# Patient Record
Sex: Female | Born: 1990 | Race: Black or African American | Hispanic: No | Marital: Single | Smoking: Never smoker
Health system: Southern US, Community
[De-identification: ages and names within clinical notes are randomized; demographics above are authoritative.]

## PROBLEM LIST (undated history)

## (undated) DIAGNOSIS — J45909 Unspecified asthma, uncomplicated: Secondary | ICD-10-CM

## (undated) HISTORY — PX: NO PAST SURGERIES: SHX2092

---
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
## 2011-06-26 ENCOUNTER — Emergency Department (HOSPITAL_COMMUNITY)
Admission: EM | Admit: 2011-06-26 | Discharge: 2011-06-26 | Disposition: A | Discharge status: Home / Self Care | Payer: Self-pay | Attending: Emergency Medicine | Admitting: Emergency Medicine

## 2011-06-26 DIAGNOSIS — L500 Allergic urticaria: Secondary | ICD-10-CM | POA: Insufficient documentation

## 2011-10-04 ENCOUNTER — Encounter (HOSPITAL_COMMUNITY): Payer: Self-pay | Admitting: *Deleted

## 2011-10-04 ENCOUNTER — Emergency Department (HOSPITAL_COMMUNITY)
Admission: EM | Admit: 2011-10-04 | Discharge: 2011-10-04 | Disposition: A | Discharge status: Home / Self Care | Payer: Medicaid Other | Attending: Emergency Medicine | Admitting: Emergency Medicine

## 2011-10-04 DIAGNOSIS — J029 Acute pharyngitis, unspecified: Secondary | ICD-10-CM | POA: Insufficient documentation

## 2011-10-04 DIAGNOSIS — R509 Fever, unspecified: Secondary | ICD-10-CM | POA: Insufficient documentation

## 2011-10-04 DIAGNOSIS — R059 Cough, unspecified: Secondary | ICD-10-CM | POA: Insufficient documentation

## 2011-10-04 DIAGNOSIS — R5381 Other malaise: Secondary | ICD-10-CM | POA: Insufficient documentation

## 2011-10-04 DIAGNOSIS — R05 Cough: Secondary | ICD-10-CM | POA: Insufficient documentation

## 2011-10-04 MED ORDER — SODIUM CHLORIDE 0.9 % IV BOLUS (SEPSIS)
1000.0000 mL | Freq: Once | INTRAVENOUS | Status: AC
  Administered 2011-10-04: 1000 mL via INTRAVENOUS

## 2011-10-04 MED ORDER — DEXAMETHASONE SODIUM PHOSPHATE 10 MG/ML IJ SOLN
10.0000 mg | Freq: Once | INTRAMUSCULAR | Status: AC
  Administered 2011-10-04: 10 mg via INTRAVENOUS
  Filled 2011-10-04: qty 1

## 2011-10-04 MED ORDER — GI COCKTAIL ~~LOC~~
30.0000 mL | Freq: Once | ORAL | Status: AC
  Administered 2011-10-04: 30 mL via ORAL
  Filled 2011-10-04: qty 30

## 2011-10-04 MED ORDER — CLINDAMYCIN PHOSPHATE 600 MG/50ML IV SOLN
600.0000 mg | Freq: Once | INTRAVENOUS | Status: AC
  Administered 2011-10-04: 600 mg via INTRAVENOUS
  Filled 2011-10-04: qty 50

## 2011-10-04 MED ORDER — CLINDAMYCIN HCL 150 MG PO CAPS: 150.0000 mg | ORAL_CAPSULE | Freq: Four times a day (QID) | ORAL | Status: AC

## 2011-10-04 MED ORDER — ONDANSETRON HCL 4 MG/2ML IJ SOLN
4.0000 mg | Freq: Once | INTRAMUSCULAR | Status: AC
  Administered 2011-10-04: 4 mg via INTRAVENOUS
  Filled 2011-10-04: qty 2

## 2011-10-04 MED ORDER — MORPHINE SULFATE 4 MG/ML IJ SOLN
4.0000 mg | Freq: Once | INTRAMUSCULAR | Status: AC
  Administered 2011-10-04: 4 mg via INTRAVENOUS
  Filled 2011-10-04: qty 1

## 2011-10-04 MED ORDER — MAGIC MOUTHWASH W/LIDOCAINE: 15.0000 mL | Freq: Four times a day (QID) | ORAL | Status: DC | PRN

## 2011-10-04 NOTE — Discharge Instructions (Signed)
Sore Throat Sore throats may be caused by bacteria and viruses. They may also be caused by:  Smoking.   Pollution.   Allergies.  If a sore throat is due to strep infection (a bacterial infection), you may need:  A throat swab.   A culture test to verify the strep infection.  You will need one of these:  An antibiotic shot.   Oral medicine for a full 10 days.  Strep infection is very contagious. A doctor should check any close contacts who have a sore throat or fever. A sore throat caused by a virus infection will usually last only 3-4 days. Antibiotics will not treat a viral sore throat.  Infectious mononucleosis (a viral disease), however, can cause a sore throat that lasts for up to 3 weeks. Mononucleosis can be diagnosed with blood tests. You must have been sick for at least 1 week in order for the test to give accurate results. HOME CARE INSTRUCTIONS   To treat a sore throat, take mild pain medicine.   Increase your fluids.   Eat a soft diet.   Do not smoke.   Gargling with warm water or salt water (1 tsp. salt in 8 oz. water) can be helpful.   Try throat sprays or lozenges or sucking on hard candy to ease the symptoms.  Call your doctor if your sore throat lasts longer than 1 week.  SEEK IMMEDIATE MEDICAL CARE IF:  You have difficulty breathing.   You have increased swelling in the throat.   You have pain so severe that you are unable to swallow fluids or your saliva.   You have a severe headache, a high fever, vomiting, or a red rash.  Document Released: 09/23/2004 Document Revised: 04/28/2011 Document Reviewed: 08/03/2007 [employer] Patient Information 2012 [employer]. 

## 2011-10-04 NOTE — ED Notes (Signed)
Pt reports sore throat and vomiting since yesterday. Fever yesterday. Reports congestion, chills.

## 2011-10-04 NOTE — ED Provider Notes (Signed)
History     CSN: [id number]  Arrival date & time 10/04/11  1230   First MD Initiated Contact with Patient 10/04/11 1452      Chief Complaint  Patient presents with  . Emesis  . Sore Throat    (Consider location/radiation/quality/duration/timing/severity/associated sxs/prior treatment) HPI Comments: Has associated URI symptoms.  No odynophagia or dysphagia  Patient is a 21 y.o. female presenting with pharyngitis. The history is provided by the patient and a parent. No language interpreter was used.  Sore Throat This is a new problem. The current episode started yesterday. The problem occurs constantly. The problem has been gradually worsening. Pertinent negatives include no chest pain, no abdominal pain, no headaches and no shortness of breath. The symptoms are aggravated by swallowing and eating. The symptoms are relieved by nothing. She has tried nothing for the symptoms. The treatment provided no relief.    Past Medical History  Diagnosis Date  . Arthritis     History reviewed. No pertinent past surgical history.  No family history on file.  History  Substance Use Topics  . Smoking status: Never Smoker   . Smokeless tobacco: Not on file  . Alcohol Use: No    OB History    Grav Para Term Preterm Abortions TAB SAB Ect Mult Living                  Review of Systems  Constitutional: Positive for fever and fatigue. Negative for activity change and appetite change.  HENT: Positive for congestion, sore throat and rhinorrhea. Negative for drooling, trouble swallowing, neck pain, neck stiffness and voice change.   Respiratory: Positive for cough. Negative for shortness of breath.   Cardiovascular: Negative for chest pain and palpitations.  Gastrointestinal: Negative for nausea, vomiting and abdominal pain.  Genitourinary: Negative for dysuria, urgency, frequency and flank pain.  Musculoskeletal: Negative for myalgias, back pain and arthralgias.  Neurological: Negative for  dizziness, weakness, light-headedness, numbness and headaches.  All other systems reviewed and are negative.    Allergies  Review of patient's allergies indicates no known allergies.  Home Medications   Current Outpatient Rx  Name Route Sig Dispense Refill  . ALBUTEROL SULFATE HFA 108 (90 BASE) MCG/ACT IN AERS Inhalation Inhale 2 puffs into the lungs every 6 (six) hours as needed. wheezing    . ASPIRIN EFFERVESCENT 325 MG PO TBEF Oral Take 325 mg by mouth every 6 (six) hours as needed. Cold /flu    . IBUPROFEN 200 MG PO TABS Oral Take 200 mg by mouth every 6 (six) hours as needed. pain    . MAGIC MOUTHWASH W/LIDOCAINE Oral Take 15 mLs by mouth 4 (four) times daily as needed. 150 mL 0  . CLINDAMYCIN HCL 150 MG PO CAPS Oral Take 1 capsule (150 mg total) by mouth every 6 (six) hours. 28 capsule 0    BP 145/80  Pulse 88  Temp(Src) 98.4 F (36.9 C) (Oral)  Resp 16  Ht 5\' 10"  (1.778 m)  SpO2 98%  LMP 09/03/2011  Physical Exam  Nursing note and vitals reviewed. Constitutional: She is oriented to person, place, and time. She appears well-developed and well-nourished. No distress.  HENT:  Head: Normocephalic and atraumatic.  Mouth/Throat: Oropharyngeal exudate present.       Oropharyngeal erythema and swelling - no evidence of PTA  Eyes: Conjunctivae and EOM are normal. Pupils are equal, round, and reactive to light.  Neck: Normal range of motion. Neck supple.  Cardiovascular: Normal rate, normal heart  sounds and intact distal pulses.  Exam reveals no gallop and no friction rub.   No murmur heard. Pulmonary/Chest: Effort normal and breath sounds normal. No respiratory distress.  Abdominal: Soft. Bowel sounds are normal. There is no tenderness.  Musculoskeletal: Normal range of motion. She exhibits no tenderness.  Lymphadenopathy:    She has no cervical adenopathy.  Neurological: She is alert and oriented to person, place, and time. No cranial nerve deficit.  Skin: Skin is warm and  dry. No rash noted.    ED Course  Procedures (including critical care time)   Labs Reviewed  RAPID STREP SCREEN   No results found.   1. Pharyngitis       MDM  Pharyngitis. Strep was negative. She'll be placed on clindamycin to cover for early tonsillitis. She received a dose of Decadron emergency department. I also administered a GI cocktail for comfort. She'll be discharged home with instructions to take the medication as directed and to continue aggressive oral hydration at home.        Dayton Bailiff, MD 10/04/11 [id number]

## 2013-10-10 ENCOUNTER — Emergency Department (HOSPITAL_COMMUNITY): Payer: Self-pay

## 2013-10-10 ENCOUNTER — Emergency Department (HOSPITAL_COMMUNITY)
Admission: EM | Admit: 2013-10-10 | Discharge: 2013-10-10 | Disposition: A | Discharge status: Home / Self Care | Payer: Self-pay | Attending: Emergency Medicine | Admitting: Emergency Medicine

## 2013-10-10 ENCOUNTER — Encounter (HOSPITAL_COMMUNITY): Payer: Self-pay | Admitting: Emergency Medicine

## 2013-10-10 DIAGNOSIS — Z79899 Other long term (current) drug therapy: Secondary | ICD-10-CM | POA: Insufficient documentation

## 2013-10-10 DIAGNOSIS — R000 Tachycardia, unspecified: Secondary | ICD-10-CM | POA: Insufficient documentation

## 2013-10-10 DIAGNOSIS — J069 Acute upper respiratory infection, unspecified: Secondary | ICD-10-CM | POA: Insufficient documentation

## 2013-10-10 DIAGNOSIS — J111 Influenza due to unidentified influenza virus with other respiratory manifestations: Secondary | ICD-10-CM | POA: Insufficient documentation

## 2013-10-10 DIAGNOSIS — R6889 Other general symptoms and signs: Secondary | ICD-10-CM

## 2013-10-10 DIAGNOSIS — M129 Arthropathy, unspecified: Secondary | ICD-10-CM | POA: Insufficient documentation

## 2013-10-10 IMAGING — CR DG CHEST 2V
2 series · 2 of 2 positions shown · non-contrast
Comparison: June 13, 2008

CLINICAL DATA: Cough, chills

EXAM:
CHEST  2 VIEW

[w chest pa]
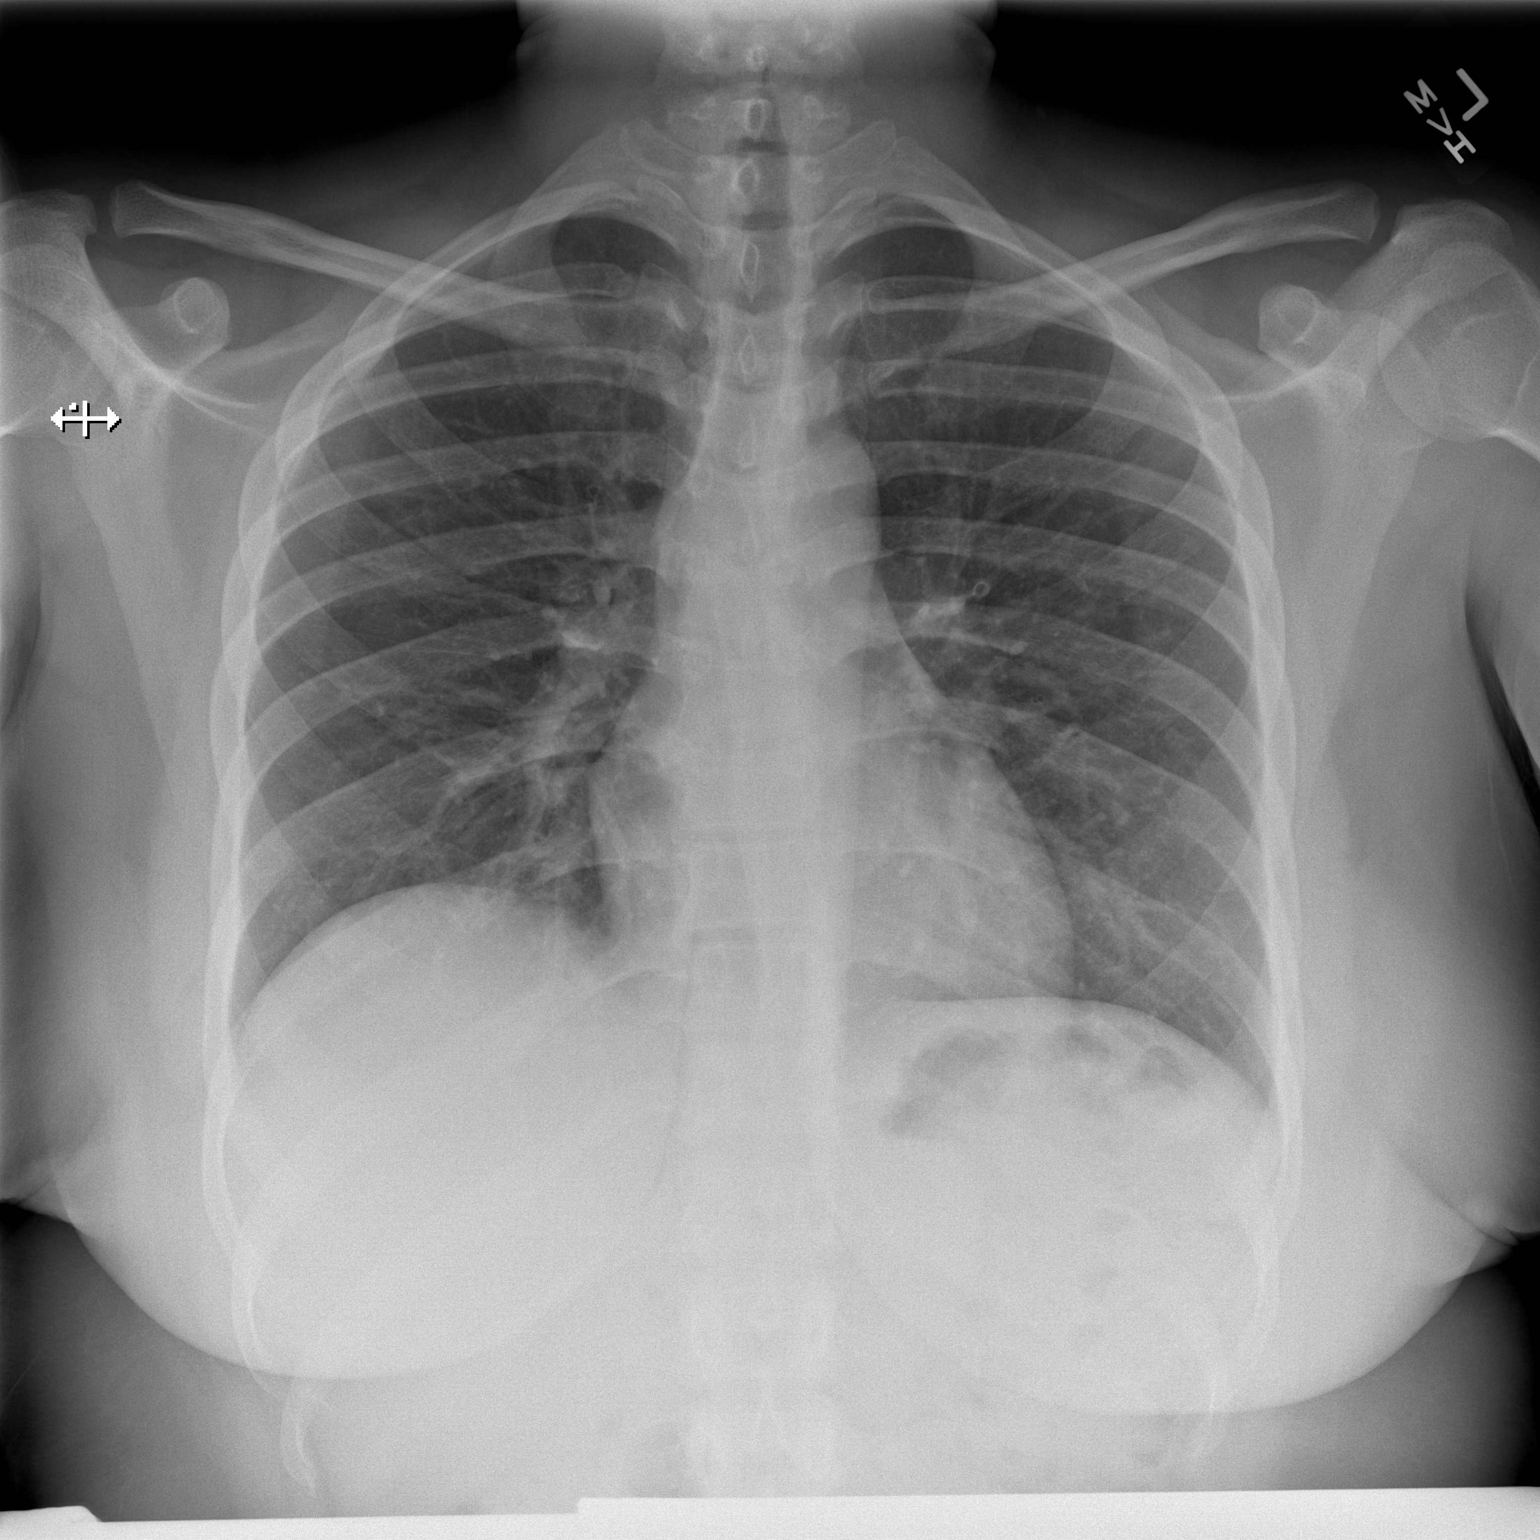

[w chest lat]
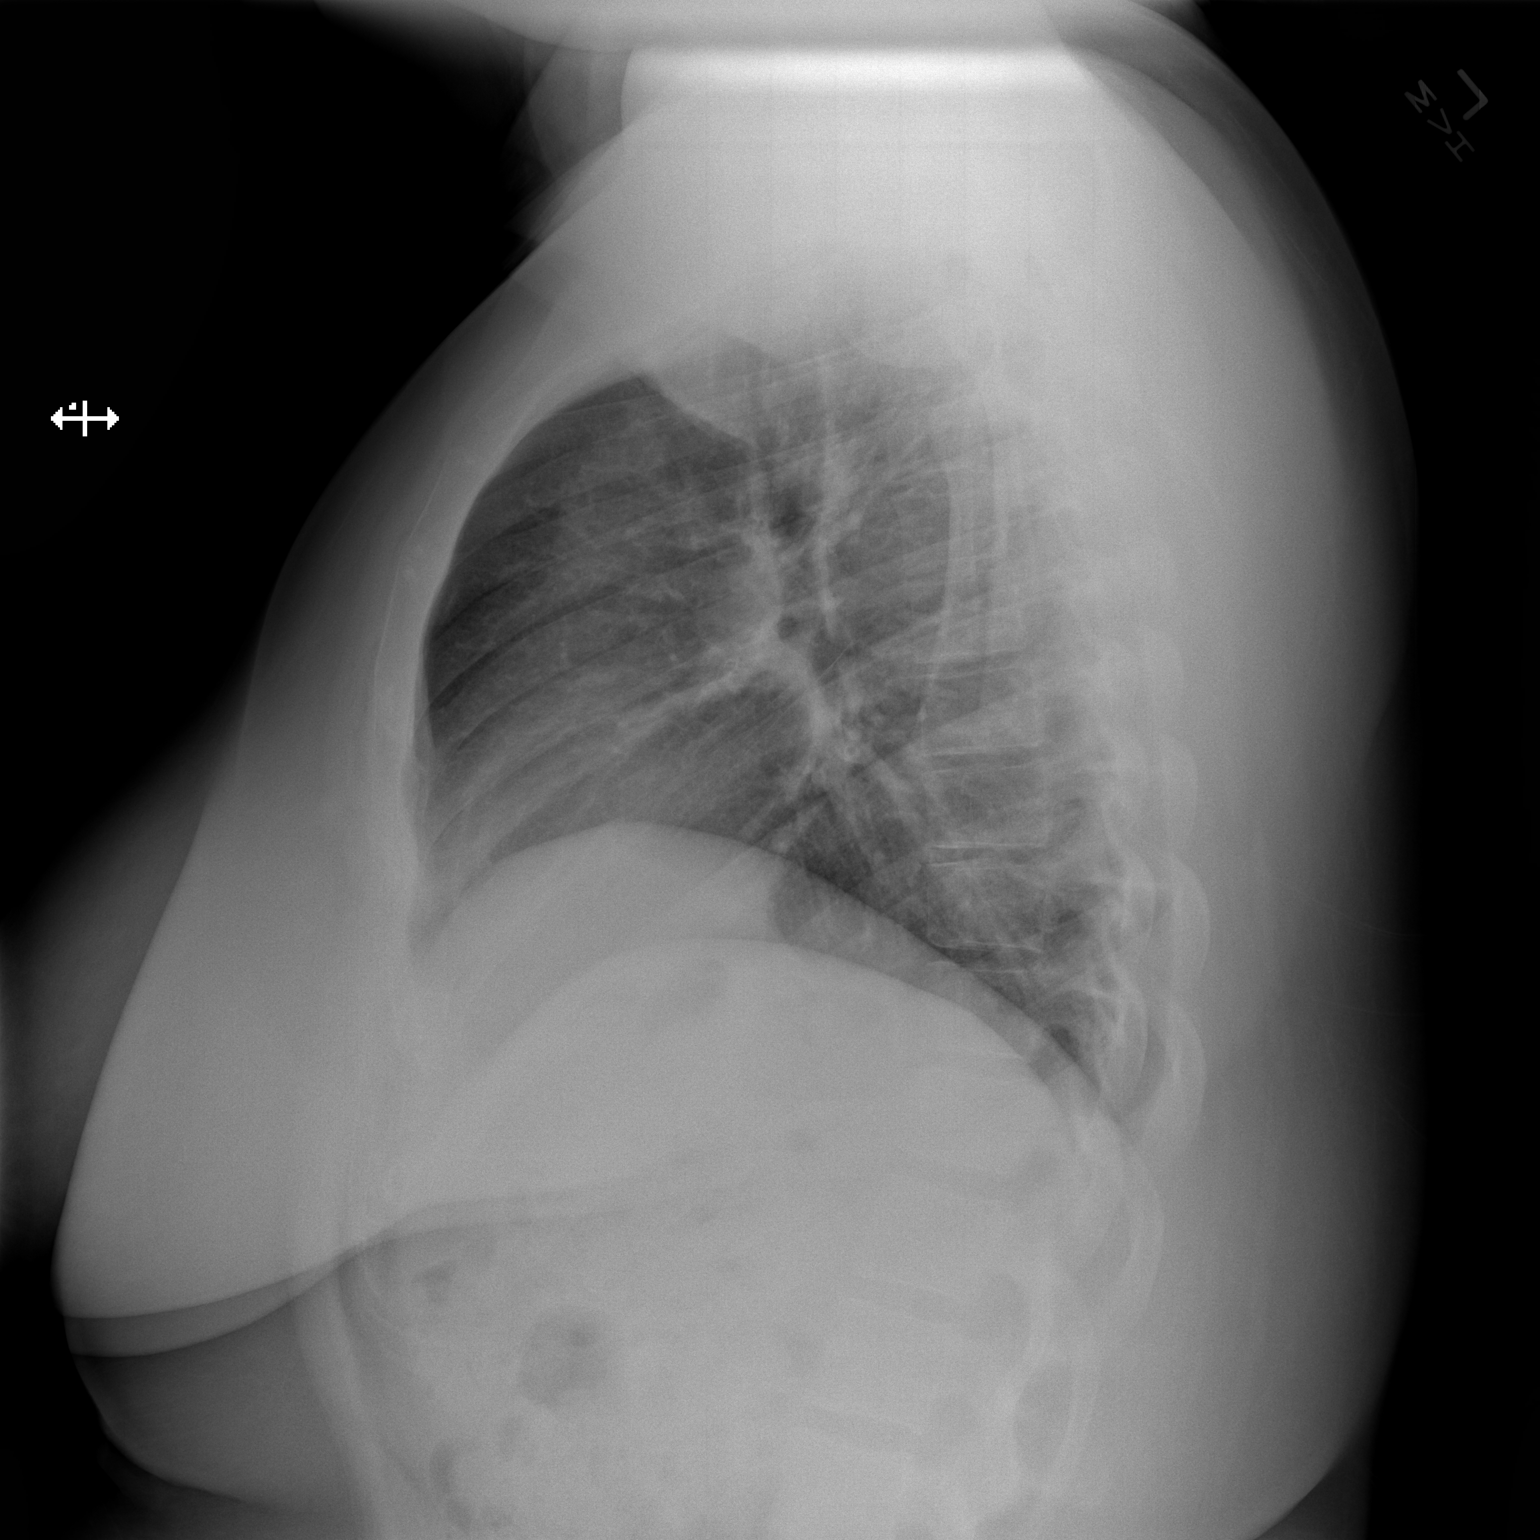

[2 of 2 positions shown; findings below may reference images not displayed]

FINDINGS: The heart size and mediastinal contours are within normal limits.
There is no focal infiltrate, pulmonary edema, or pleural effusion.
The visualized skeletal structures are stable.
IMPRESSION: No active cardiopulmonary disease.

## 2013-10-10 MED ORDER — ACETAMINOPHEN 325 MG PO TABS
650.0000 mg | ORAL_TABLET | Freq: Once | ORAL | Status: AC
  Administered 2013-10-10: 650 mg via ORAL
  Filled 2013-10-10: qty 2

## 2013-10-10 MED ORDER — ALBUTEROL SULFATE HFA 108 (90 BASE) MCG/ACT IN AERS
2.0000 | INHALATION_SPRAY | Freq: Once | RESPIRATORY_TRACT | Status: AC
  Administered 2013-10-10: 2 via RESPIRATORY_TRACT
  Filled 2013-10-10: qty 6.7

## 2013-10-10 MED ORDER — ALBUTEROL SULFATE (2.5 MG/3ML) 0.083% IN NEBU
5.0000 mg | INHALATION_SOLUTION | Freq: Once | RESPIRATORY_TRACT | Status: AC
  Administered 2013-10-10: 5 mg via RESPIRATORY_TRACT
  Filled 2013-10-10: qty 6

## 2013-10-10 NOTE — Discharge Instructions (Signed)
Use inhaler as directed. Rest, stay well-hydrated. Continue taking over-the-counter medications as needed.  Upper Respiratory Infection, Adult An upper respiratory infection (URI) is also sometimes known as the common cold. The upper respiratory tract includes the nose, sinuses, throat, trachea, and bronchi. Bronchi are the airways leading to the lungs. Most people improve within 1 week, but symptoms can last up to 2 weeks. A residual cough may last even longer.  CAUSES Many different viruses can infect the tissues lining the upper respiratory tract. The tissues become irritated and inflamed and often become very moist. Mucus production is also common. A cold is contagious. You can easily spread the virus to others by oral contact. This includes kissing, sharing a glass, coughing, or sneezing. Touching your mouth or nose and then touching a surface, which is then touched by another person, can also spread the virus. SYMPTOMS  Symptoms typically develop 1 to 3 days after you come in contact with a cold virus. Symptoms vary from person to person. They may include:  Runny nose.  Sneezing.  Nasal congestion.  Sinus irritation.  Sore throat.  Loss of voice (laryngitis).  Cough.  Fatigue.  Muscle aches.  Loss of appetite.  Headache.  Low-grade fever. DIAGNOSIS  You might diagnose your own cold based on familiar symptoms, since most people get a cold 2 to 3 times a year. Your caregiver can confirm this based on your exam. Most importantly, your caregiver can check that your symptoms are not due to another disease such as strep throat, sinusitis, pneumonia, asthma, or epiglottitis. Blood tests, throat tests, and X-rays are not necessary to diagnose a common cold, but they may sometimes be helpful in excluding other more serious diseases. Your caregiver will decide if any further tests are required. RISKS AND COMPLICATIONS  You may be at risk for a more severe case of the common cold if you  smoke cigarettes, have chronic heart disease (such as heart failure) or lung disease (such as asthma), or if you have a weakened immune system. The very young and very old are also at risk for more serious infections. Bacterial sinusitis, middle ear infections, and bacterial pneumonia can complicate the common cold. The common cold can worsen asthma and chronic obstructive pulmonary disease (COPD). Sometimes, these complications can require emergency medical care and may be life-threatening. PREVENTION  The best way to protect against getting a cold is to practice good hygiene. Avoid oral or hand contact with people with cold symptoms. Wash your hands often if contact occurs. There is no clear evidence that vitamin C, vitamin E, echinacea, or exercise reduces the chance of developing a cold. However, it is always recommended to get plenty of rest and practice good nutrition. TREATMENT  Treatment is directed at relieving symptoms. There is no cure. Antibiotics are not effective, because the infection is caused by a virus, not by bacteria. Treatment may include:  Increased fluid intake. Sports drinks offer valuable electrolytes, sugars, and fluids.  Breathing heated mist or steam (vaporizer or shower).  Eating chicken soup or other clear broths, and maintaining good nutrition.  Getting plenty of rest.  Using gargles or lozenges for comfort.  Controlling fevers with ibuprofen or acetaminophen as directed by your caregiver.  Increasing usage of your inhaler if you have asthma. Zinc gel and zinc lozenges, taken in the first 24 hours of the common cold, can shorten the duration and lessen the severity of symptoms. Pain medicines may help with fever, muscle aches, and throat pain. A  variety of non-prescription medicines are available to treat congestion and runny nose. Your caregiver can make recommendations and may suggest nasal or lung inhalers for other symptoms.  HOME CARE INSTRUCTIONS   Only take  over-the-counter or prescription medicines for pain, discomfort, or fever as directed by your caregiver.  Use a warm mist humidifier or inhale steam from a shower to increase air moisture. This may keep secretions moist and make it easier to breathe.  Drink enough water and fluids to keep your urine clear or pale yellow.  Rest as needed.  Return to work when your temperature has returned to normal or as your caregiver advises. You may need to stay home longer to avoid infecting others. You can also use a face mask and careful hand washing to prevent spread of the virus. SEEK MEDICAL CARE IF:   After the first few days, you feel you are getting worse rather than better.  You need your caregiver's advice about medicines to control symptoms.  You develop chills, worsening shortness of breath, or brown or red sputum. These may be signs of pneumonia.  You develop yellow or brown nasal discharge or pain in the face, especially when you bend forward. These may be signs of sinusitis.  You develop a fever, swollen neck glands, pain with swallowing, or white areas in the back of your throat. These may be signs of strep throat. SEEK IMMEDIATE MEDICAL CARE IF:   You have a fever.  You develop severe or persistent headache, ear pain, sinus pain, or chest pain.  You develop wheezing, a prolonged cough, cough up blood, or have a change in your usual mucus (if you have chronic lung disease).  You develop sore muscles or a stiff neck. Document Released: 02/09/2001 Document Revised: 11/08/2011 Document Reviewed: 12/18/2010 [facility] Patient Information 2014 [place], [place].  Viral Infections A viral infection can be caused by different types of viruses.Most viral infections are not serious and resolve on their own. However, some infections may cause severe symptoms and may lead to further complications. SYMPTOMS Viruses can frequently cause:  Minor sore throat.  Aches and  pains.  Headaches.  Runny nose.  Different types of rashes.  Watery eyes.  Tiredness.  Cough.  Loss of appetite.  Gastrointestinal infections, resulting in nausea, vomiting, and diarrhea. These symptoms do not respond to antibiotics because the infection is not caused by bacteria. However, you might catch a bacterial infection following the viral infection. This is sometimes called a "superinfection." Symptoms of such a bacterial infection may include:  Worsening sore throat with pus and difficulty swallowing.  Swollen neck glands.  Chills and a high or persistent fever.  Severe headache.  Tenderness over the sinuses.  Persistent overall ill feeling (malaise), muscle aches, and tiredness (fatigue).  Persistent cough.  Yellow, green, or brown mucus production with coughing. HOME CARE INSTRUCTIONS   Only take over-the-counter or prescription medicines for pain, discomfort, diarrhea, or fever as directed by your caregiver.  Drink enough water and fluids to keep your urine clear or pale yellow. Sports drinks can provide valuable electrolytes, sugars, and hydration.  Get plenty of rest and maintain proper nutrition. Soups and broths with crackers or rice are fine. SEEK IMMEDIATE MEDICAL CARE IF:   You have severe headaches, shortness of breath, chest pain, neck pain, or an unusual rash.  You have uncontrolled vomiting, diarrhea, or you are unable to keep down fluids.  You or your child has an oral temperature above 102 F (38.9 C), not  controlled by medicine.  Your baby is older than 3 months with a rectal temperature of 102 F (38.9 C) or higher.  Your baby is 26 months old or younger with a rectal temperature of 100.4 F (38 C) or higher. MAKE SURE YOU:   Understand these instructions.  Will watch your condition.  Will get help right away if you are not doing well or get worse. Document Released: 05/26/2005 Document Revised: 11/08/2011 Document Reviewed:  12/21/2010 [facility] Patient Information 2014 [place], [place].  Influenza, Adult Influenza ("the flu") is a viral infection of the respiratory tract. It occurs more often in winter months because people spend more time in close contact with one another. Influenza can make you feel very sick. Influenza easily spreads from person to person (contagious). CAUSES  Influenza is caused by a virus that infects the respiratory tract. You can catch the virus by breathing in droplets from an infected person's cough or sneeze. You can also catch the virus by touching something that was recently contaminated with the virus and then touching your mouth, nose, or eyes. SYMPTOMS  Symptoms typically last 4 to 10 days and may include:  Fever.  Chills.  Headache, body aches, and muscle aches.  Sore throat.  Chest discomfort and cough.  Poor appetite.  Weakness or feeling tired.  Dizziness.  Nausea or vomiting. DIAGNOSIS  Diagnosis of influenza is often made based on your history and a physical exam. A nose or throat swab test can be done to confirm the diagnosis. RISKS AND COMPLICATIONS You may be at risk for a more severe case of influenza if you smoke cigarettes, have diabetes, have chronic heart disease (such as heart failure) or lung disease (such as asthma), or if you have a weakened immune system. Elderly people and pregnant women are also at risk for more serious infections. The most common complication of influenza is a lung infection (pneumonia). Sometimes, this complication can require emergency medical care and may be life-threatening. PREVENTION  An annual influenza vaccination (flu shot) is the best way to avoid getting influenza. An annual flu shot is now routinely recommended for all adults in the [place] TREATMENT  In mild cases, influenza goes away on its own. Treatment is directed at relieving symptoms. For more severe cases, your caregiver may prescribe antiviral medicines to shorten  the sickness. Antibiotic medicines are not effective, because the infection is caused by a virus, not by bacteria. HOME CARE INSTRUCTIONS  Only take over-the-counter or prescription medicines for pain, discomfort, or fever as directed by your caregiver.  Use a cool mist humidifier to make breathing easier.  Get plenty of rest until your temperature returns to normal. This usually takes 3 to 4 days.  Drink enough fluids to keep your urine clear or pale yellow.  Cover your mouth and nose when coughing or sneezing, and wash your hands well to avoid spreading the virus.  Stay home from work or school until your fever has been gone for at least 1 full day. SEEK MEDICAL CARE IF:   You have chest pain or a deep cough that worsens or produces more mucus.  You have nausea, vomiting, or diarrhea. SEEK IMMEDIATE MEDICAL CARE IF:   You have difficulty breathing, shortness of breath, or your skin or nails turn bluish.  You have severe neck pain or stiffness.  You have a severe headache, facial pain, or earache.  You have a worsening or recurring fever.  You have nausea or vomiting that cannot be controlled.  MAKE SURE YOU:  Understand these instructions.  Will watch your condition.  Will get help right away if you are not doing well or get worse. Document Released: 08/13/2000 Document Revised: 02/15/2012 Document Reviewed: 11/15/2011 [facility] Patient Information 2014 [place], [place].

## 2013-10-10 NOTE — ED Notes (Signed)
Pt c/o cough, chills, body aches x 2 days. Denies n/v/d.

## 2013-10-10 NOTE — ED Provider Notes (Signed)
CSN: [id number]     Arrival date & time 10/10/13  2025 History  This chart was scribed for non-physician practitioner, Nada Boozerobyn M. Mathis FareAlbert, PA-C working with Rolland PorterMark James, MD by Luisa DagoPriscilla Tutu, ED scribe. This patient was seen in room WTR9/WTR9 and the patient's care was started at 8:52 PM.    Chief Complaint  Patient presents with  . Generalized Body Aches  . Cough  . Chills    The history is provided by the patient. No language interpreter was used.   Marland Kitchen.HPI Comments: Erin Allison is a 23 y.o. female who presents to the Emergency Department complaining of generalized myalgias that started 2 days ago. Pt is also complaining of a dry cough and chills. She reports taking alka-seltzer with little to no relief. Pt states that she couldn't do much today due to her symptoms, so she slept all day. Pt denies any sore throat. She denies any sick contacts or recent travels.  Past Medical History  Diagnosis Date  . Arthritis    History reviewed. No pertinent past surgical history. No family history on file. History  Substance Use Topics  . Smoking status: Never Smoker   . Smokeless tobacco: Not on file  . Alcohol Use: Yes     Comment: occasional   OB History   Grav Para Term Preterm Abortions TAB SAB Ect Mult Living                 Review of Systems A complete 10 system review of systems was obtained and all systems are negative except as noted in the HPI and PMH.     Allergies  Review of patient's allergies indicates no known allergies.  Home Medications   Current Outpatient Rx  Name  Route  Sig  Dispense  Refill  . albuterol (PROVENTIL HFA;VENTOLIN HFA) 108 (90 BASE) MCG/ACT inhaler   Inhalation   Inhale 2 puffs into the lungs every 6 (six) hours as needed for wheezing. wheezing         . aspirin-sod bicarb-citric acid (ALKA-SELTZER) 325 MG TBEF   Oral   Take 325 mg by mouth every 6 (six) hours as needed. Cold /flu         . ibuprofen (ADVIL,MOTRIN) 200 MG tablet  Oral   Take 200 mg by mouth every 6 (six) hours as needed. pain          Triage vitals: BP 136/83  Pulse 108  Temp(Src) 101.3 F (38.5 C) (Oral)  Resp 22  Ht 5\' 9"  (1.753 m)  Wt 260 lb (117.935 kg)  BMI 38.38 kg/m2  SpO2 99%  Physical Exam  Nursing note and vitals reviewed. Constitutional: She is oriented to person, place, and time. She appears well-developed and well-nourished. No distress.  HENT:  Head: Normocephalic and atraumatic.  Right Ear: Tympanic membrane normal.  Left Ear: Tympanic membrane normal.  Mouth/Throat: Oropharynx is clear and moist.  Nasal turbinate is inflamed bilaterally. Post nasal drip.   Eyes: Conjunctivae and EOM are normal.  Neck: Normal range of motion. Neck supple.  Cardiovascular: Regular rhythm and normal heart sounds.  Tachycardia present.   Pulmonary/Chest: Effort normal. No respiratory distress. She has wheezes (mild right upper lung field).  Musculoskeletal: Normal range of motion. She exhibits no edema.  Neurological: She is alert and oriented to person, place, and time. No sensory deficit.  Skin: Skin is warm and dry.  Psychiatric: She has a normal mood and affect. Her behavior is normal.    ED Course  Procedures (including critical care time)  DIAGNOSTIC STUDIES: Oxygen Saturation is 99% on RA, normal by my interpretation.    COORDINATION OF CARE: 8:59 PM- Will order medications. Pt advised of plan for treatment and pt agrees.  Medications  albuterol (PROVENTIL HFA;VENTOLIN HFA) 108 (90 BASE) MCG/ACT inhaler 2 puff (not administered)  albuterol (PROVENTIL) (2.5 MG/3ML) 0.083% nebulizer solution 5 mg (5 mg Nebulization Given 10/10/13 2136)  acetaminophen (TYLENOL) tablet 650 mg (650 mg Oral Given 10/10/13 2136)    Labs Review Labs Reviewed - No data to display Imaging Review Dg Chest 2 View  10/10/2013   CLINICAL DATA:  Cough, chills  EXAM: CHEST  2 VIEW  COMPARISON:  June 13, 2008  FINDINGS: The heart size and mediastinal  contours are within normal limits. There is no focal infiltrate, pulmonary edema, or pleural effusion. The visualized skeletal structures are stable.  IMPRESSION: No active cardiopulmonary disease.   Electronically Signed   By: Sherian Rein M.D.   On: 10/10/2013 21:11    EKG Interpretation   None       MDM   Final diagnoses:  URI (upper respiratory infection)  Flu-like symptoms    Patient presenting with flulike symptoms. She appears in no apparent distress. Febrile at 11.3, slightly tachycardic at 105. Chest x-ray obtained due to right-sided upper lung field wheezing. Chest x-ray clear. After receiving nebulizer treatment, patient reports she feels slight relief of her cough. On reexamination, lungs clear. Patient is stable for discharge home. Albuterol inhaler given. Symptomatic treatment discussed. Return precautions given. Patient states understanding of treatment care plan and is agreeable.   I personally performed the services described in this documentation, which was scribed in my presence. The recorded information has been reviewed and is accurate.     Trevor Mace, PA-C 10/10/13 2158

## 2013-10-15 NOTE — ED Provider Notes (Signed)
Medical screening examination/treatment/procedure(s) were performed by non-physician practitioner and as supervising physician I was immediately available for consultation/collaboration.  EKG Interpretation   None         Rosi Secrist, MD 10/15/13 2239 

## 2014-06-10 ENCOUNTER — Encounter (HOSPITAL_COMMUNITY): Payer: Self-pay | Admitting: Emergency Medicine

## 2014-06-10 ENCOUNTER — Emergency Department (HOSPITAL_COMMUNITY)
Admission: EM | Admit: 2014-06-10 | Discharge: 2014-06-10 | Disposition: A | Discharge status: Home / Self Care | Payer: Medicaid Other | Attending: Emergency Medicine | Admitting: Emergency Medicine

## 2014-06-10 DIAGNOSIS — R51 Headache: Secondary | ICD-10-CM | POA: Diagnosis present

## 2014-06-10 DIAGNOSIS — M1990 Unspecified osteoarthritis, unspecified site: Secondary | ICD-10-CM | POA: Diagnosis not present

## 2014-06-10 DIAGNOSIS — O9989 Other specified diseases and conditions complicating pregnancy, childbirth and the puerperium: Secondary | ICD-10-CM | POA: Insufficient documentation

## 2014-06-10 DIAGNOSIS — Z3A12 12 weeks gestation of pregnancy: Secondary | ICD-10-CM | POA: Diagnosis not present

## 2014-06-10 DIAGNOSIS — R519 Headache, unspecified: Secondary | ICD-10-CM

## 2014-06-10 DIAGNOSIS — Z79899 Other long term (current) drug therapy: Secondary | ICD-10-CM | POA: Diagnosis not present

## 2014-06-10 LAB — URINALYSIS, ROUTINE W REFLEX MICROSCOPIC
BILIRUBIN URINE: NEGATIVE
GLUCOSE, UA: NEGATIVE mg/dL
Hgb urine dipstick: NEGATIVE
KETONES UR: NEGATIVE mg/dL
Leukocytes, UA: NEGATIVE
Nitrite: NEGATIVE
PH: 6.5 (ref 5.0–8.0)
PROTEIN: NEGATIVE mg/dL
Specific Gravity, Urine: 1.013 (ref 1.005–1.030)
Urobilinogen, UA: 1 mg/dL (ref 0.0–1.0)

## 2014-06-10 LAB — CBC WITH DIFFERENTIAL/PLATELET
BASOS PCT: 1 % (ref 0–1)
Basophils Absolute: 0.1 10*3/uL (ref 0.0–0.1)
EOS ABS: 0.1 10*3/uL (ref 0.0–0.7)
EOS PCT: 1 % (ref 0–5)
HEMATOCRIT: 37.4 % (ref 36.0–46.0)
HEMOGLOBIN: 12 g/dL (ref 12.0–15.0)
LYMPHS ABS: 2.1 10*3/uL (ref 0.7–4.0)
Lymphocytes Relative: 33 % (ref 12–46)
MCH: 21.5 pg — AB (ref 26.0–34.0)
MCHC: 32.1 g/dL (ref 30.0–36.0)
MCV: 67 fL — AB (ref 78.0–100.0)
MONO ABS: 0.8 10*3/uL (ref 0.1–1.0)
Monocytes Relative: 12 % (ref 3–12)
NEUTROS ABS: 3.3 10*3/uL (ref 1.7–7.7)
Neutrophils Relative %: 53 % (ref 43–77)
Platelets: 295 10*3/uL (ref 150–400)
RBC: 5.58 MIL/uL — AB (ref 3.87–5.11)
RDW: 14.6 % (ref 11.5–15.5)
WBC: 6.4 10*3/uL (ref 4.0–10.5)

## 2014-06-10 LAB — BASIC METABOLIC PANEL
Anion gap: 13 (ref 5–15)
BUN: 5 mg/dL — AB (ref 6–23)
CALCIUM: 9.5 mg/dL (ref 8.4–10.5)
CO2: 20 meq/L (ref 19–32)
CREATININE: 0.59 mg/dL (ref 0.50–1.10)
Chloride: 102 mEq/L (ref 96–112)
GFR calc Af Amer: >90 mL/min (ref >90)
GFR calc non Af Amer: >90 mL/min (ref >90)
GLUCOSE: 93 mg/dL (ref 70–99)
Potassium: 4.2 mEq/L (ref 3.7–5.3)
SODIUM: 135 meq/L — AB (ref 137–147)

## 2014-06-10 LAB — PREGNANCY, URINE: Preg Test, Ur: POSITIVE — AB

## 2014-06-10 MED ORDER — ACETAMINOPHEN 500 MG PO TABS
1000.0000 mg | ORAL_TABLET | Freq: Once | ORAL | Status: AC
  Administered 2014-06-10: 1000 mg via ORAL
  Filled 2014-06-10: qty 2

## 2014-06-10 MED ORDER — METOCLOPRAMIDE HCL 10 MG PO TABS
10.0000 mg | ORAL_TABLET | Freq: Once | ORAL | Status: AC
  Administered 2014-06-10: 10 mg via ORAL
  Filled 2014-06-10: qty 1

## 2014-06-10 MED ORDER — ACETAMINOPHEN 325 MG PO TABS: 650.0000 mg | ORAL_TABLET | Freq: Once | ORAL | Status: DC

## 2014-06-10 NOTE — ED Provider Notes (Signed)
CSN: [id number]     Arrival date & time 06/10/14  1218 History   First MD Initiated Contact with Patient 06/10/14 1341     Chief Complaint  Patient presents with  . Headache     (Consider location/radiation/quality/duration/timing/severity/associated sxs/prior Treatment) HPI Ms. Leatha GildingLivingston is a 23 year old female with past medical history of arthritis who presents the ER today with a headache. Patient states for the past week she has had a constant headache which is in her bilateral temporal regions. Patient states her headache as a throbbing, does not seem to wax and wane, has no aggravating or alleviating factors. Patient has tried taking Tylenol and ibuprofen with minimal to no relief. Patient reports some mild associated nausea, and denies any associated vomiting, photophobia, phonophobia, blurred vision, dizziness, weakness. Patient reports one episode of lightheadedness earlier in the week last week, when she "stood up too fast". Patient states she has not felt that since. Patient states she typically has tension headaches, which responds to Tylenol or ibuprofen. Patient states her headache this week is consistent with previous headaches, however "a little bit worse". Patient states her typical headaches also do not last this long.  Past Medical History  Diagnosis Date  . Arthritis    History reviewed. No pertinent past surgical history. No family history on file. History  Substance Use Topics  . Smoking status: Never Smoker   . Smokeless tobacco: Not on file  . Alcohol Use: Yes     Comment: occasional   OB History   Grav Para Term Preterm Abortions TAB SAB Ect Mult Living                 Review of Systems  Constitutional: Negative for fever.  HENT: Negative for trouble swallowing.   Eyes: Negative for visual disturbance.  Respiratory: Negative for shortness of breath.   Cardiovascular: Negative for chest pain.  Gastrointestinal: Negative for nausea, vomiting and abdominal  pain.  Genitourinary: Negative for dysuria.  Musculoskeletal: Negative for neck pain.  Skin: Negative for rash.  Neurological: Positive for headaches. Negative for dizziness, weakness and numbness.  Psychiatric/Behavioral: Negative.       Allergies  Review of patient's allergies indicates no known allergies.  Home Medications   Prior to Admission medications   Medication Sig Start Date End Date Taking? Authorizing Provider  acetaminophen (TYLENOL) 325 MG tablet Take 650 mg by mouth every 6 (six) hours as needed for headache (headache).   Yes Historical Provider, MD  acetaminophen (TYLENOL) 500 MG tablet Take 1,000 mg by mouth every 6 (six) hours as needed for headache (headache).   Yes Historical Provider, MD  albuterol (PROVENTIL HFA;VENTOLIN HFA) 108 (90 BASE) MCG/ACT inhaler Inhale 2 puffs into the lungs every 6 (six) hours as needed for wheezing (wheezing). wheezing   Yes Historical Provider, MD   BP 133/73  Pulse 100  Temp(Src) 98.2 F (36.8 C) (Oral)  Resp 22  SpO2 100%  LMP 03/14/2014 Physical Exam  Nursing note and vitals reviewed. Constitutional: She is oriented to person, place, and time. She appears well-developed and well-nourished. No distress.  HENT:  Head: Normocephalic and atraumatic.  Mouth/Throat: Oropharynx is clear and moist. No oropharyngeal exudate.  Eyes: EOM are normal. Pupils are equal, round, and reactive to light. Right eye exhibits no discharge. Left eye exhibits no discharge. No scleral icterus.  Neck: Normal range of motion.  Cardiovascular: Normal rate, regular rhythm and normal heart sounds.   No murmur heard. Pulmonary/Chest: Effort normal and breath sounds normal.  No respiratory distress.  Abdominal: Soft. There is no tenderness.  Musculoskeletal: Normal range of motion. She exhibits no edema and no tenderness.  Neurological: She is alert and oriented to person, place, and time. She has normal strength. No cranial nerve deficit or sensory  deficit. She displays a negative Romberg sign. Coordination and gait normal. GCS eye subscore is 4. GCS verbal subscore is 5. GCS motor subscore is 6.  Patient fully alert answering questions appropriately in full, clear sentences. Motor strength 5 out of 5 in all major muscle groups of upper and lower extremities. Distal sensation intact.  Skin: Skin is warm and dry. No rash noted. She is not diaphoretic.  Psychiatric: She has a normal mood and affect.    ED Course  Procedures (including critical care time) Labs Review Labs Reviewed  PREGNANCY, URINE - Abnormal; Notable for the following:    Preg Test, Ur POSITIVE (*)    All other components within normal limits  CBC WITH DIFFERENTIAL - Abnormal; Notable for the following:    RBC 5.58 (*)    MCV 67.0 (*)    MCH 21.5 (*)    All other components within normal limits  BASIC METABOLIC PANEL - Abnormal; Notable for the following:    Sodium 135 (*)    BUN 5 (*)    All other components within normal limits  URINALYSIS, ROUTINE W REFLEX MICROSCOPIC    Imaging Review No results found.   EKG Interpretation None      MDM   Final diagnoses:  Headache, unspecified headache type    23 year old female presenting with one week of headache. Patient stating she typically has headaches on a frequent basis, however this one has extended for a longer period of time than her typical headaches. Patient denies any new symptoms with her headache, or new headache. Neuro exam benign.   Urinalysis unremarkable. Urine pregnancy positive. We will continue symptomatic therapy with Tylenol, Reglan.  Pt HA treated and improved while in ED.  Presentation is like pts typical HA and non concerning [facility], ICH, Meningitis, or temporal arteritis. Pt is afebrile with no focal neuro deficits, nuchal rigidity, or change in vision. Pt is to follow up with PCP to discuss prophylactic medication. Pt verbalizes understanding and is agreeable with plan to dc. I  [facility] and resource guide to help her find prenatal care as she is now approximately [redacted] weeks pregnant. I encouraged patient to call or return to ER should her symptoms change, persist, worsen or should she have any questions or concerns.  BP 133/73  Pulse 100  Temp(Src) 98.2 F (36.8 C) (Oral)  Resp 22  SpO2 100%  LMP 03/14/2014  Signed,  Ladona MowJoe Lahoma Constantin, PA-C 5:23 PM  This patient discussed with Dr. Linwood DibblesJon Knapp, MD      Monte FantasiaJoseph W Mattis Featherly, PA-C 06/10/14 1724

## 2014-06-10 NOTE — Discharge Instructions (Signed)
Continue drinking plenty of fluids a daily basis. You may use Tylenol one to 2 pills every 4-6 hours as needed for headache. Take prenatal vitamins daily. Followup with women's clinic or refer to resource guide below to help you find prenatal care.   General Headache Without Cause A headache is pain or discomfort felt around the head or neck area. The specific cause of a headache may not be found. There are many causes and types of headaches. A few common ones are:  Tension headaches.  Migraine headaches.  Cluster headaches.  Chronic daily headaches. HOME CARE INSTRUCTIONS   Keep all follow-up appointments with your caregiver or any specialist referral.  Only take over-the-counter or prescription medicines for pain or discomfort as directed by your caregiver.  Lie down in a dark, quiet room when you have a headache.  Keep a headache journal to find out what may trigger your migraine headaches. For example, write down:  What you eat and drink.  How much sleep you get.  Any change to your diet or medicines.  Try massage or other relaxation techniques.  Put ice packs or heat on the head and neck. Use these 3 to 4 times per day for 15 to 20 minutes each time, or as needed.  Limit stress.  Sit up straight, and do not tense your muscles.  Quit smoking if you smoke.  Limit alcohol use.  Decrease the amount of caffeine you drink, or stop drinking caffeine.  Eat and sleep on a regular schedule.  Get 7 to 9 hours of sleep, or as recommended by your caregiver.  Keep lights dim if bright lights bother you and make your headaches worse. SEEK MEDICAL CARE IF:   You have problems with the medicines you were prescribed.  Your medicines are not working.  You have a change from the usual headache.  You have nausea or vomiting. SEEK IMMEDIATE MEDICAL CARE IF:   Your headache becomes severe.  You have a fever.  You have a stiff neck.  You have loss of vision.  You have  muscular weakness or loss of muscle control.  You start losing your balance or have trouble walking.  You feel faint or pass out.  You have severe symptoms that are different from your first symptoms. MAKE SURE YOU:   Understand these instructions.  Will watch your condition.  Will get help right away if you are not doing well or get worse. Document Released: 08/16/2005 Document Revised: 11/08/2011 Document Reviewed: 09/01/2011 [facility] Patient Information 2015 [place], [place]. This information is not intended to replace advice given to you by your health care provider. Make sure you discuss any questions you have with your health care provider.   Emergency Department Resource Guide 1) Find a Doctor and Pay Out of Pocket Although you won't have to find out who is covered by your insurance plan, it is a good idea to ask around and get recommendations. You will then need to call the office and see if the doctor you have chosen will accept you as a new patient and what types of options they offer for patients who are self-pay. Some doctors offer discounts or will set up payment plans for their patients who do not have insurance, but you will need to ask so you aren't surprised when you get to your appointment.  2) Contact Your Local Health Department Not all health departments have doctors that can see patients for sick visits, but many do, so it is worth  a call to see if yours does. If you don't know where your local health department is, you can check in your phone book. The CDC also has a tool to help you locate your state's health department, and many state websites also have listings of all of their local health departments.  3) Find a Walk-in Clinic If your illness is not likely to be very severe or complicated, you may want to try a walk in clinic. These are popping up all over the country in pharmacies, drugstores, and shopping centers. They're usually staffed by nurse practitioners or  physician assistants that have been trained to treat common illnesses and complaints. They're usually fairly quick and inexpensive. However, if you have serious medical issues or chronic medical problems, these are probably not your best option.  No Primary Care Doctor: - Call Health Connect at  [phone] - they can help you locate a primary care doctor that  accepts your insurance, provides certain services, etc. - Physician Referral Service- [phone]  Chronic Pain Problems: Organization         Address  Phone   Notes  [facility]  [phone] Patients need to be referred by their primary care doctor.   Medication Assistance: Organization         Address  Phone   Notes  [facility] Medication [facility] [address] [place], [place] [address] [phone] --Must be a resident of [facility] -- Must have NO insurance coverage whatsoever (no Medicaid/ Medicare, etc.) -- The pt. MUST have a primary care doctor that directs their care regularly and follows them in the community   MedAssist  [phone]   [employer]  [phone]    Agencies that provide inexpensive medical care: Organization         Address  Phone   Notes  [facility]  [phone]   [facility]    [phone]   [facility] [address] [place], [place] [address] [phone]   [facility] [address] [place] [place] [phone]   Planned Parenthood    [phone]   [facility]    [phone]   Community Health and [facility]  [address], [place] Phone:  [phone], Fax:  [phone] Hours of Operation:  9 am - 6 pm, M-F.  Also accepts Medicaid/Medicare and self-pay.  [facility]  [address], [place] Phone: [phone], Fax: [phone]. Hours of Operation:  8:30 am - 5:30 pm, M-F.   Also accepts Medicaid and self-pay.  [facility] [place] [address], [place] Phone: [phone]   [facility] [address] Natasha Bence [place], [place] [phone], Ext. [phone] Mondays & Thursdays: 7-9 AM.  First 15 patients are seen on a first come, first serve basis.    Medicaid-accepting [facility] Providers:  Organization         Address  Phone   Notes  [facility] [address],  [phone] Also accepts self-pay patients.  [facility] [address] Laurell Josephs [place], [place]  [phone]   [facility] [address], [place] [phone]   [facility] [address] [place] [phone]   Renaye Rakers [address] [place], [place]  7, [place]   [phone]) [phone] Only accepts [place] Access Medicaid patients after they have their name applied to their card.   Self-Pay (no insurance) in [facility] [place]:  Organization         Address  Phone   Notes  Sickle Cell Patients, [facility] [address], [place] [phone]) [phone]   [facility] [address], [place] [phone]) [phone]   [facility] [place]  [address], [place] [phone]   [facility]/Dr. Osei-Bonsu  [address] [place] [address], [place] or [phone], [place] [phone] Phone number for both [place] and [place] locations is the same.  [facility] [address], [place] [phone]) [phone]   [facility]  [address] [place] [address], [place] or [address] [phone]) [phone]   [facility] [address], [place] [phone]) [phone], phone; [phone], fax Sees patients 1st and 3rd Saturday of every month.  Must not qualify for public or private insurance (i.e. Medicaid, Medicare, [place] Health Choice, Veterans'  Benefits)  Household income should be no more than 200% of the poverty level The clinic cannot treat you if you are pregnant or think you are pregnant  Sexually transmitted diseases are not treated at the clinic.    Dental Care: Organization         Address  Phone  Notes  [facility] of [facility] [address], [place] [phone] Accepts children up to age 22 who are enrolled in [place] or [place] Health Choice; pregnant women with a Medicaid card; and children who have applied for Medicaid or [place] Health Choice, but were declined, whose parents can pay a reduced fee at time of service.  [facility] [facility] of [facility] [place]  [address], [place] [phone] Accepts children up to age 22 who are enrolled in [place] or [place] Health Choice; pregnant women with a Medicaid card; and children who have applied for Medicaid or [place] Health Choice, but were declined, whose parents can pay a reduced fee at time of service.  [facility]  [address], [place] [phone] Patients are seen by appointment only. Walk-ins are not accepted. [facility] will see patients 22 years of age and older. Monday - Tuesday (8am-5pm) Most Wednesdays (8:30-5pm) $30 per visit, cash only  [facility]  [address], [facility] [phone] Patients are seen by appointment only. Walk-ins are not accepted. [facility] will see patients 22 years of age and older. One Wednesday Evening (Monthly: Volunteer Based).  $30 per visit, cash only  [facility]  [phone] for adults; Children under age 734, call Graduate Pediatric Dentistry at [phone]) [phone]. Children aged 584-14, please call [phone] to request a pediatric application.  Dental services are provided in all areas of dental care including fillings, crowns and bridges, complete and partial  dentures, implants, gum treatment, root canals, and extractions. Preventive care is also provided. Treatment is provided to both adults and children. Patients are selected via a lottery and there is often a waiting list.   [facility] [address], [place]  [phone] [email or web address]   [facility] [address], [place], [place] [phone], Ext. [phone] Second and Fourth Thursday of each  month, opens at 6:30 AM; Clinic ends at 9 AM.  Patients are seen on a first-come first-served basis, and a limited number are seen during each clinic.   [facility]  [address], [place], [place] [phone]   Eligibility Requirements You must have lived in [place], [place], or [place] counties for at least the last three months.   You cannot be eligible for state or federal sponsored National Cityhealthcare insurance, including CIGNAVeterans Administration, [place]icaid, or [employer]Medicare.   You generally cannot be eligible for healthcare insurance through your employer.    How to apply: Eligibility screenings are held every Tuesday and Wednesday afternoon from 1:00 pm until 4:00 pm. You do not need an appointment for the interview!  [facility] [address], [place], [place] [phone]   [facility]  [phone]   [facility]  [phone]   [facility]  [phone]    Behavioral Health Resources in the Community: Intensive Outpatient Programs Organization         Address  Phone  Notes  [facility] [address], [place], [place] [phone]   [facility] Outpatient [address], [place], [place] [phone]   [facility] [address], [place], [place]  [phone]   [facility] [address],  [place], [place] [phone] or [phone]   Substance Abuse Resources Organization          Address  Phone  Notes  [facility]  [phone]   [facility]  [phone]   [facility]  [phone]   [facility]  [phone]   Residential & Outpatient Substance Abuse Program  [phone]   Psychological Services Organization         Address  Phone  Notes  [facility]  [phone]- [phone]   [facility]  [phone]   [facility] [address], [place] [phone] or [phone]    Mobile Crisis Teams Organization         Address  Phone  Notes  [facility], Mobile Crisis Care Unit  [phone]   [facility]  [address] [place], [place] [phone]   Doristine LocksSharon DeEsch [address] [place] [phone]    Self-Help/Support Groups Organization         Address  Phone             Notes  [facility] [place] - variety of support groups  [phone]- [phone] Call for more information  Narcotics Anonymous (NA), [facility] [address], [place]  2 meetings at this location   Statisticianesidential Treatment Programs Organization         Address  Phone  Notes  [facility] [address],    [place]  [phone]   [facility]  [address], [place] [phone], [place], [place] [phone]   [facility] [address], [place] [phone] Admissions: 8am-3pm M-F  [facility] [address]    [place], [place] [phone]   [facility] [address], [place], [place] [phone]   [facility] [address]  [place], [place] [phone]   [facility] - Intensive Outpatient [address], [place], [place] [phone]   [facility] [address]  [place], [place]  [phone] or [phone]   [facility] ([facility]) [address] [place], [place]  [phone] Accepts Medicaid  [facility] [address]  [place] [phone] Substance Abuse/Addiction Treatment   [facility] Resources Organization         Address  Phone  Notes  [facility]  [phone]   Angie FavaJulie Brannon, PhD [address], [address] [place], [place]   [phone](336) [phone] or [phone]) [phone]   [facility]   [address] [place], [place] [phone](336) [phone]   [facility] [address], [place], [place] [phone] Insurance/Medicaid/sponsorship through [facility]  Faith and Families [address]                                    [place], [place] [phone] Therapy/tele-psych/case  [facility] [address]   [place], [place] [phone]) [phone]    Dr. Lolly MustacheArfeen  [phone]   [facility] [place]  [employer] [facility] 1) [address], [place] 2) [address], [place] 3)  [address], [place] [phone]  [phone]) [phone]   [facility] Child Abuse Hotline [phone]) [phone] or [phone]) [phone] (After Hours)

## 2014-06-10 NOTE — ED Notes (Addendum)
Pt c/o headache x 12 week, c/o nausea but no vomiting. Denies blurred vision. Pt does not know if she could be pregnant has not had a period since July but states her periods are irregular.

## 2014-06-12 NOTE — ED Provider Notes (Signed)
Medical screening examination/treatment/procedure(s) were performed by non-physician practitioner and as supervising physician I was immediately available for consultation/collaboration.    Merary Garguilo, MD 06/12/14 1538 

## 2014-07-22 ENCOUNTER — Other Ambulatory Visit (HOSPITAL_COMMUNITY): Payer: Self-pay | Admitting: Nurse Practitioner

## 2014-07-22 DIAGNOSIS — Z3689 Encounter for other specified antenatal screening: Secondary | ICD-10-CM

## 2014-07-22 LAB — OB RESULTS CONSOLE GC/CHLAMYDIA
CHLAMYDIA, DNA PROBE: NEGATIVE
Gonorrhea: NEGATIVE

## 2014-07-22 LAB — OB RESULTS CONSOLE RPR: RPR: NONREACTIVE

## 2014-07-22 LAB — OB RESULTS CONSOLE GBS: GBS: POSITIVE

## 2014-07-22 LAB — OB RESULTS CONSOLE HIV ANTIBODY (ROUTINE TESTING): HIV: NONREACTIVE

## 2014-07-22 LAB — OB RESULTS CONSOLE ABO/RH: RH Type: POSITIVE

## 2014-07-22 LAB — OB RESULTS CONSOLE ANTIBODY SCREEN: Antibody Screen: NEGATIVE

## 2014-07-22 LAB — OB RESULTS CONSOLE HEPATITIS B SURFACE ANTIGEN: HEP B S AG: NEGATIVE

## 2014-07-22 LAB — OB RESULTS CONSOLE RUBELLA ANTIBODY, IGM: Rubella: IMMUNE

## 2014-08-01 ENCOUNTER — Ambulatory Visit (HOSPITAL_COMMUNITY)
Admission: RE | Admit: 2014-08-01 | Discharge: 2014-08-01 | Disposition: A | Discharge status: Home / Self Care | Payer: Medicaid Other | Source: Ambulatory Visit | Attending: Nurse Practitioner | Admitting: Nurse Practitioner

## 2014-08-01 ENCOUNTER — Other Ambulatory Visit (HOSPITAL_COMMUNITY): Payer: Self-pay | Admitting: Nurse Practitioner

## 2014-08-01 DIAGNOSIS — Z36 Encounter for antenatal screening of mother: Secondary | ICD-10-CM | POA: Insufficient documentation

## 2014-08-01 DIAGNOSIS — Z3689 Encounter for other specified antenatal screening: Secondary | ICD-10-CM

## 2014-08-01 DIAGNOSIS — Z3A19 19 weeks gestation of pregnancy: Secondary | ICD-10-CM | POA: Diagnosis not present

## 2014-08-01 IMAGING — US US MFM OB TRANSVAGINAL
1 series · 12 of 28 positions shown · non-contrast
Comparison: none

[Series 1: us mfm ob transvaginal · 0.17mm/px · 12 of 208 slices shown]
[im 8/208]
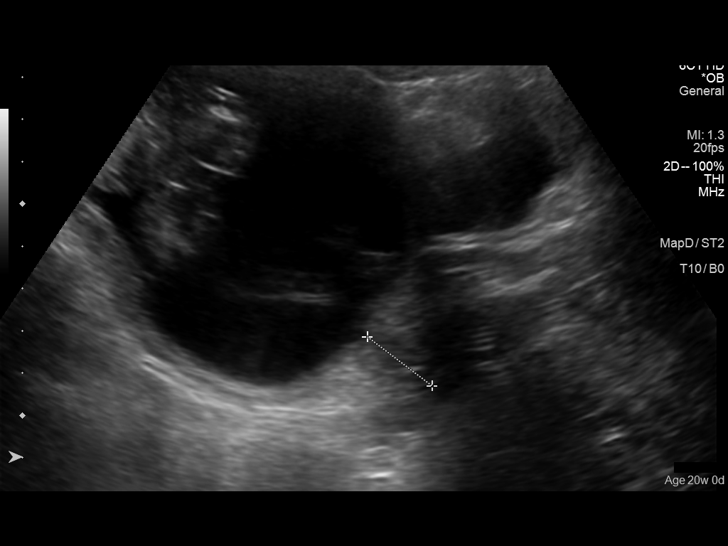
[im 24/208]
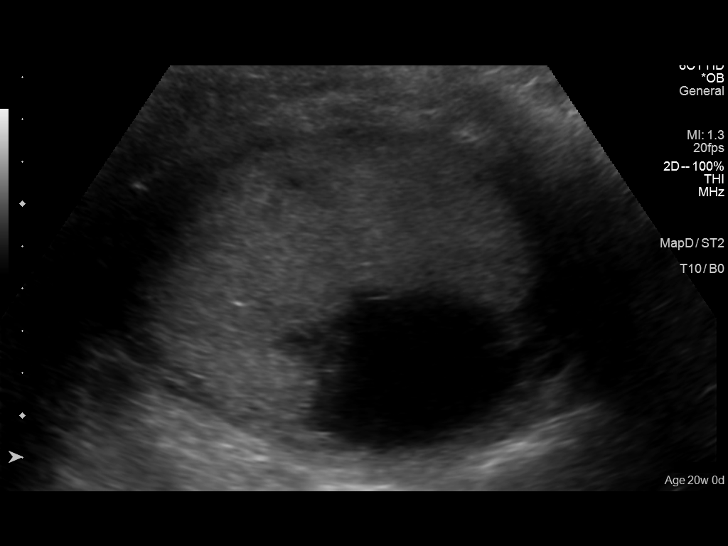
[im 39/208]
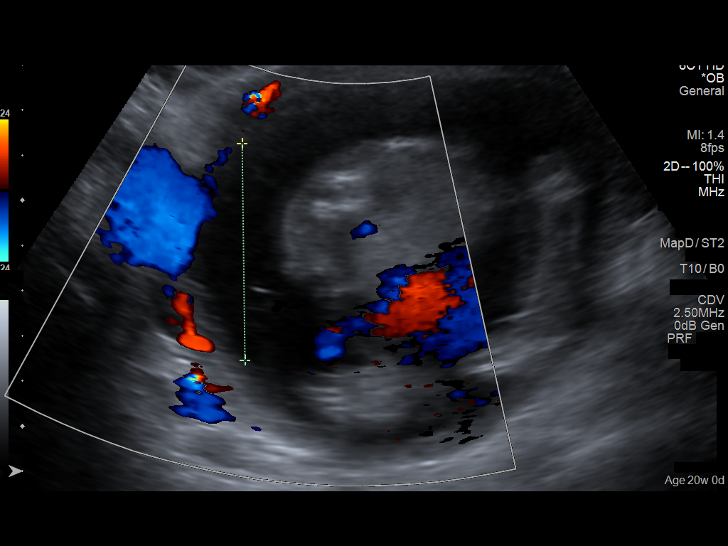
[im 62/208]
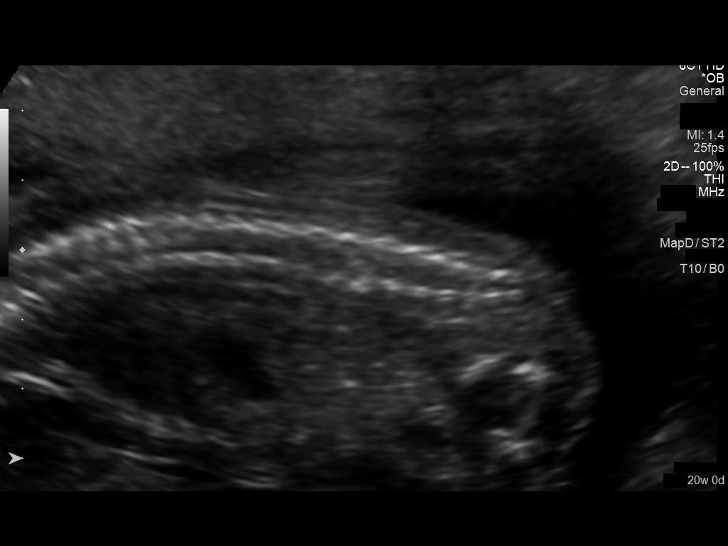
[im 77/208]
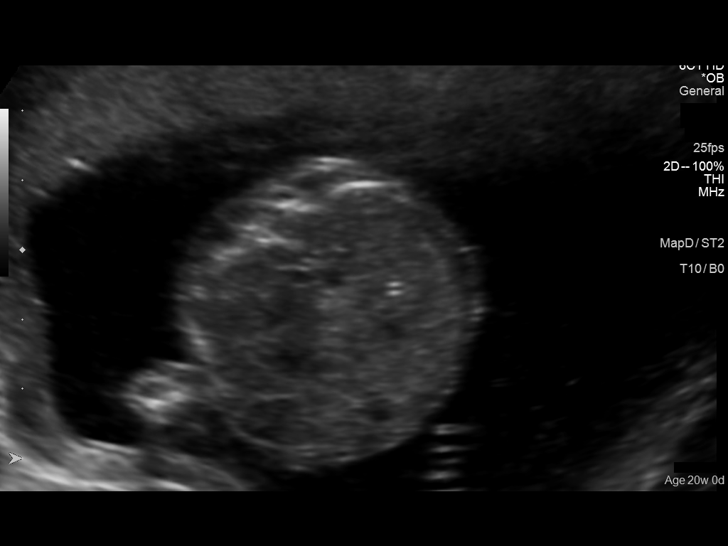
[im 93/208]
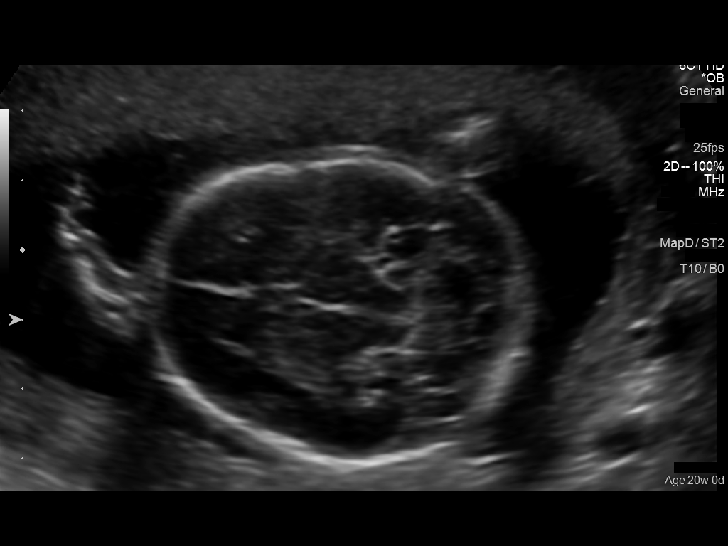
[im 116/208]
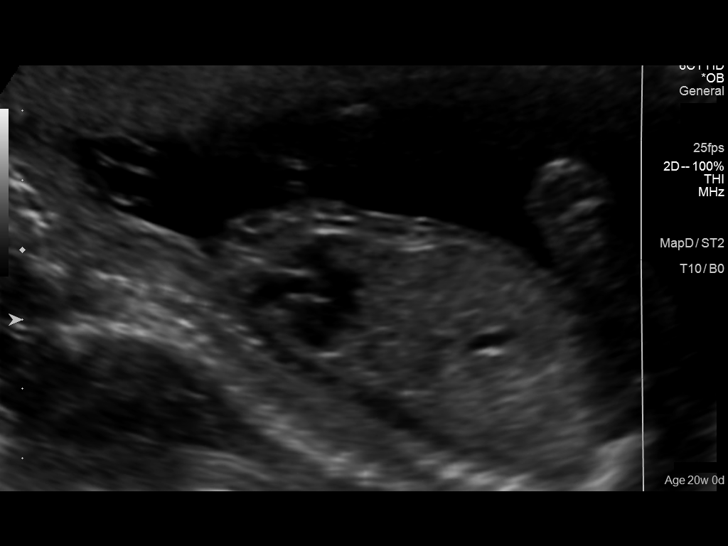
[im 131/208]
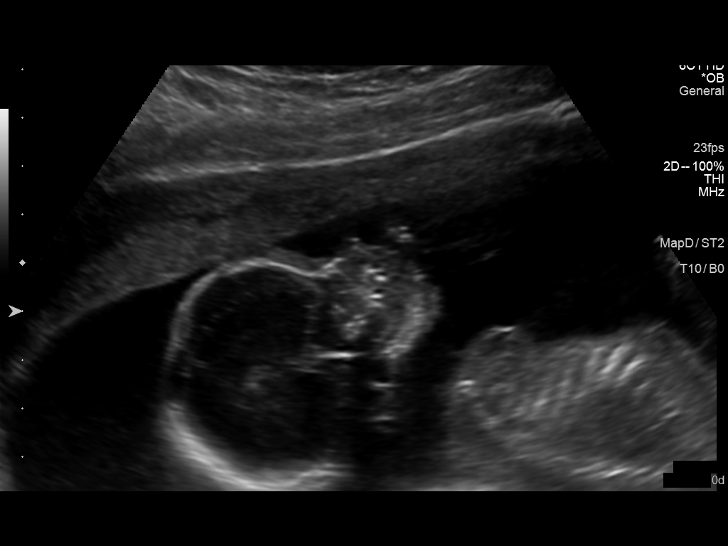
[im 146/208]
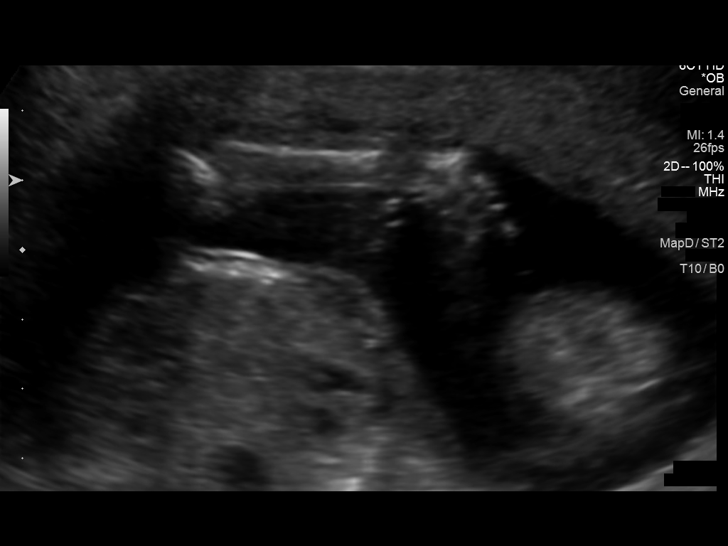
[im 169/208]
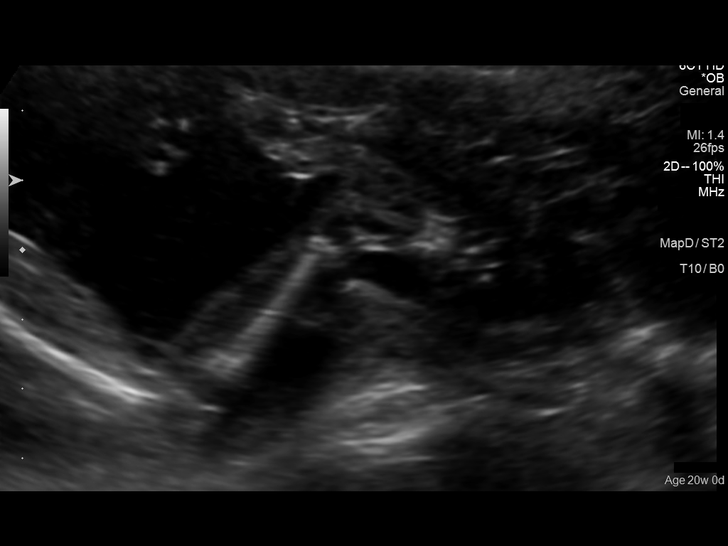
[im 185/208]
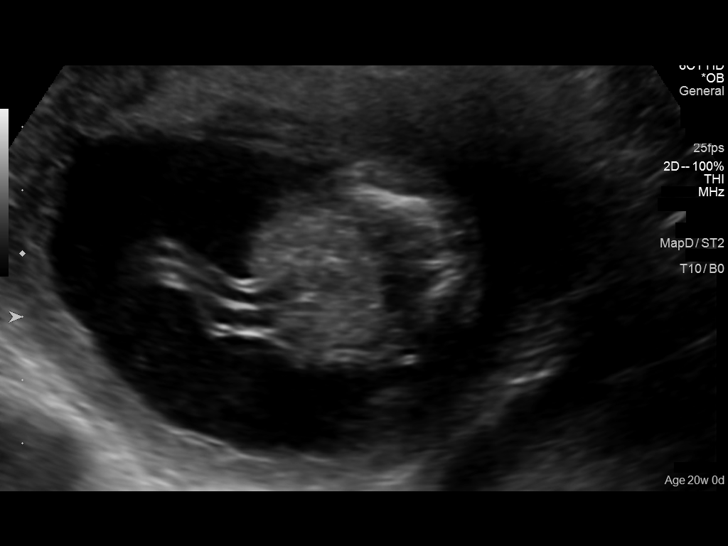
[im 200/208]
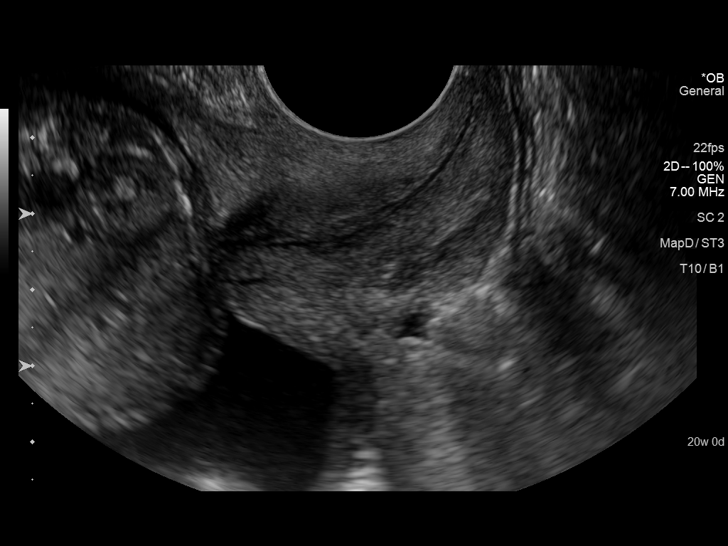

[12 of 28 positions shown; findings below may reference images not displayed]

OBSTETRICS REPORT
                      (Signed Final 08/01/2014 [DATE])

Service(s) Provided

 US MFM OB TRANSVAGINAL                                76817.2
 US OB COMP + 14 WK                                    76805.1
Indications

 19 weeks gestation of pregnancy
 Uncertain LMP  Establish Gestational Age              Z36
 Basic anatomic survey                                 z36
Fetal Evaluation

 Num Of Fetuses:    1
 Fetal Heart Rate:  158                          bpm
 Cardiac Activity:  Observed
 Presentation:      Breech
 Placenta:          Anterior, above cervical os
 P. Cord            Visualized, central
 Insertion:

 Amniotic Fluid
 AFI FV:      Subjectively within normal limits
                                             Larg Pckt:    4.80  cm
Biometry

 BPD:     43.2  mm     G. Age:  19w 0d                CI:        70.59   70 - 86
                                                      FL/HC:      18.1   16.1 -

 HC:     163.9  mm     G. Age:  19w 1d       43  %    HC/AC:      1.18   1.09 -

 AC:     138.8  mm     G. Age:  19w 2d       51  %    FL/BPD:
 FL:      29.6  mm     G. Age:  19w 1d       43  %    FL/AC:      21.3   20 - 24
 HUM:     30.4  mm     G. Age:  20w 0d       74  %

 Est. FW:     279  gm    0 lb 10 oz      46  %
Gestational Age

 LMP:           20w 0d        Date:  03/14/14                 EDD:   12/19/14
 U/S Today:     19w 1d                                        EDD:   12/25/14
 Best:          19w 1d     Det. By:  U/S (08/01/14)           EDD:   12/25/14
Anatomy
 Cranium:          Appears normal         Aortic Arch:      Appears normal
 Fetal Cavum:      Appears normal         Ductal Arch:      Appears normal
 Ventricles:       Appears normal         Diaphragm:        Not well visualized
 Choroid Plexus:   Appears normal         Stomach:          Appears normal, left
                                                            sided
 Cerebellum:       Appears normal         Abdomen:          Appears normal
 Posterior Fossa:  Appears normal         Abdominal Wall:   Appears nml (cord
                                                            insert, abd wall)
 Nuchal Fold:      Appears normal         Cord Vessels:     Appears normal (3
                                                            vessel cord)
 Face:             Appears normal         Kidneys:          Appear normal
                   (orbits and profile)
 Lips:             Not well visualized    Bladder:          Appears normal
 Heart:            Not well visualized    Spine:            Appears normal
 RVOT:             Not well visualized    Lower             Appears normal
                                          Extremities:
 LVOT:             Appears normal         Upper             Appears normal
                                          Extremities:

 Other:  Fetus appears to be a male. Heels visualized. Technically difficult due
         to maternal habitus and fetal position.
Cervix Uterus Adnexa

 Cervical Length:    4.05     cm

 Cervix:       Normal appearance by transvaginal scan
 Uterus:       No abnormality visualized.
 Cul De Sac:   No free fluid seen.

 Left Ovary:    Not visualized.
 Right Ovary:   Not visualized.

 Adnexa:     No abnormality visualized.
Impression

 SIUP at 34w3d
 EFW 46th%'le
 No dysmorphic features, limitations as above
 No previa
Recommendations

 Follow up interval growth and attempt to complete fetal
 survey in 6-8 weeks.

## 2014-08-05 ENCOUNTER — Other Ambulatory Visit (HOSPITAL_COMMUNITY): Payer: Self-pay | Admitting: Nurse Practitioner

## 2014-08-05 DIAGNOSIS — Z3689 Encounter for other specified antenatal screening: Secondary | ICD-10-CM

## 2014-08-30 NOTE — L&D Delivery Note (Signed)
Patient is 24 y.o. G1P0000 3231w6d admitted SOL  Delivery Note At 11:42 AM a viable female was delivered via Vaginal, Spontaneous Delivery (Presentation: Left Occiput Posterior).  APGAR: 9, 9; weight pending.  Infant dried and placed on mother's abdomen.  Cord clamped and cut by FOB.  Hospital cord blood sample collected.  Placenta delivered, fundal massage applied, and vagina inspected for lacerations.  Of note upon delivery of placenta, trailing membranes were noted so a lower uterine sweep was done.  Placenta status: Intact, Spontaneous.  Cord: 3 vessels with the following complications: None.  Fredirick LatheKristy Acosta, MD was gloved and actively participated in the entirety of the delivery.  Anesthesia: Epidural  Episiotomy: None Lacerations: L Labial, 1st degree perineal Suture Repair: 3.0 vicryl rapide Est. Blood Loss (mL):  535cc  Mom to postpartum.  Baby to Couplet care / Skin to Skin.  Delynn FlavinGottschalk, Ashly M, DO 12/31/2014, 12:11 PM

## 2014-09-19 ENCOUNTER — Ambulatory Visit (HOSPITAL_COMMUNITY)
Admission: RE | Admit: 2014-09-19 | Discharge: 2014-09-19 | Disposition: A | Discharge status: Home / Self Care | Payer: Medicaid Other | Source: Ambulatory Visit | Attending: Nurse Practitioner | Admitting: Nurse Practitioner

## 2014-09-19 ENCOUNTER — Other Ambulatory Visit (HOSPITAL_COMMUNITY): Payer: Self-pay | Admitting: Nurse Practitioner

## 2014-09-19 DIAGNOSIS — Z3A26 26 weeks gestation of pregnancy: Secondary | ICD-10-CM | POA: Diagnosis not present

## 2014-09-19 DIAGNOSIS — Z3689 Encounter for other specified antenatal screening: Secondary | ICD-10-CM

## 2014-09-19 DIAGNOSIS — Z36 Encounter for antenatal screening of mother: Secondary | ICD-10-CM | POA: Insufficient documentation

## 2014-09-19 IMAGING — US US OB FOLLOW-UP
2 series · 12 of 28 positions shown · non-contrast
Comparison: none

[Series 1: us ob follow up · 62 acquisitions, 10 frames shown (1 of 2)]
[im 3/62]
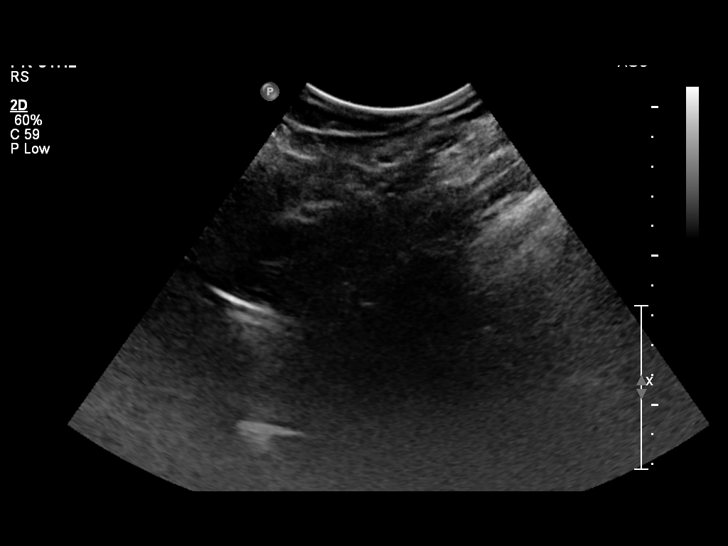
[im 9/62]
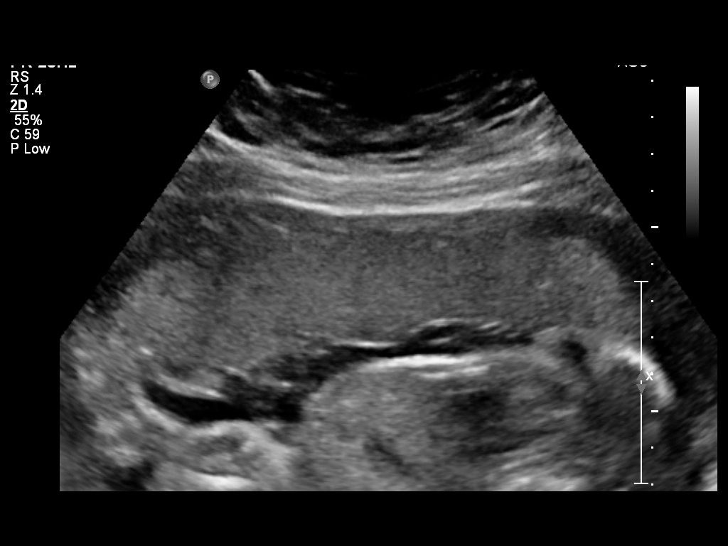
[im 14/62]
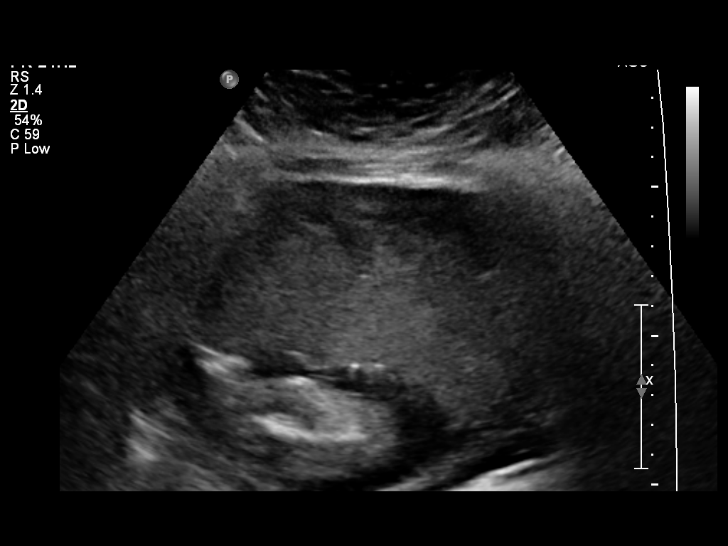
[im 23/62]
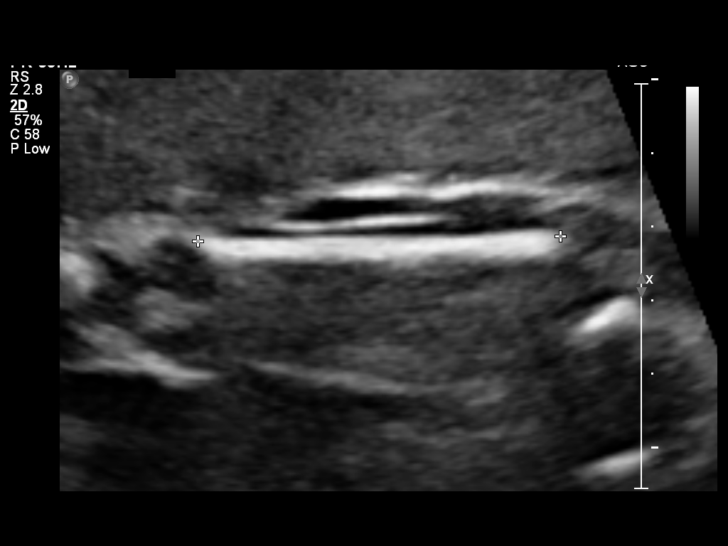
[im 28/62]
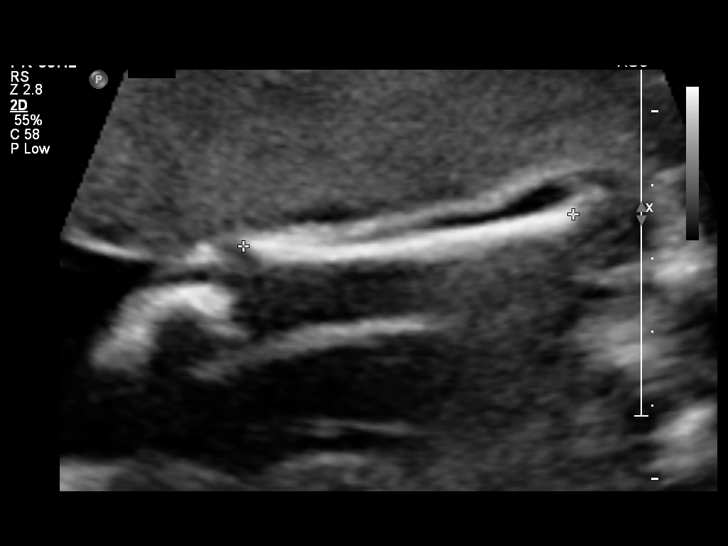
[im 34/62]
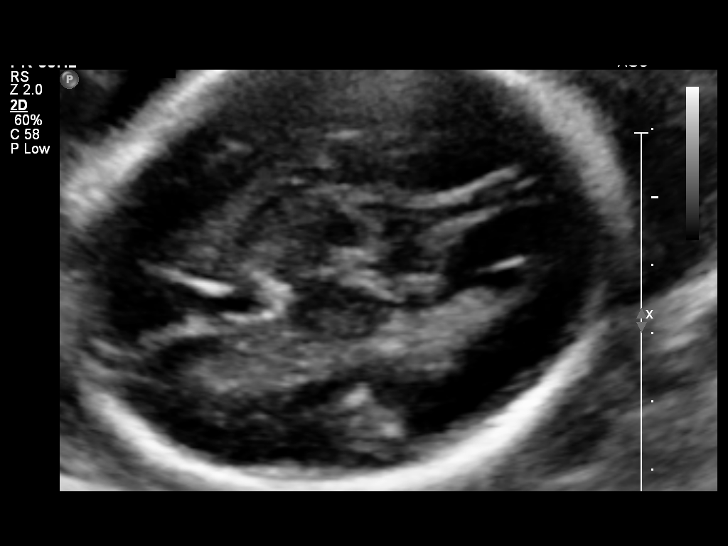
[im 42/62]
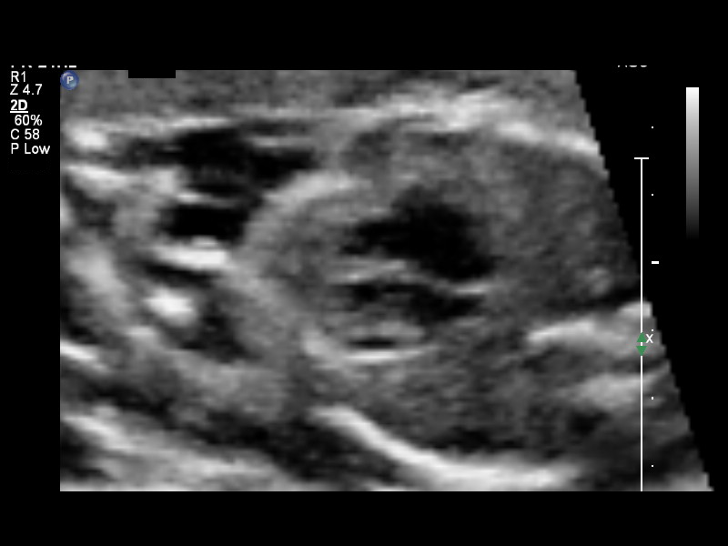
[im 48/62]
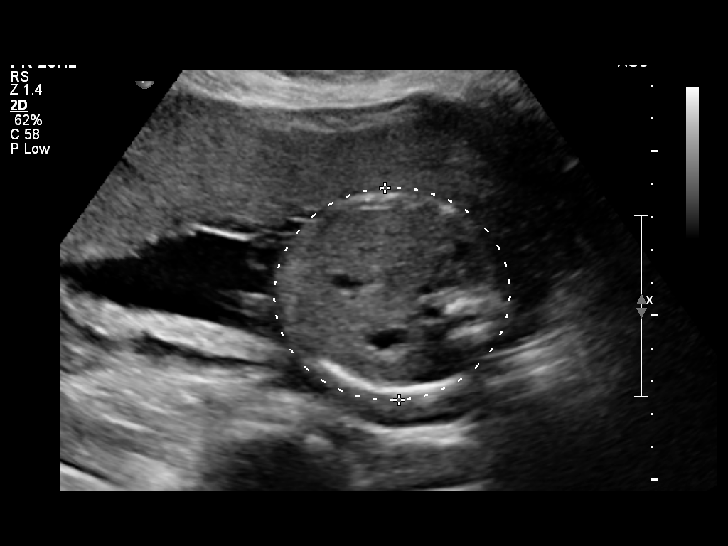
[im 53/62]
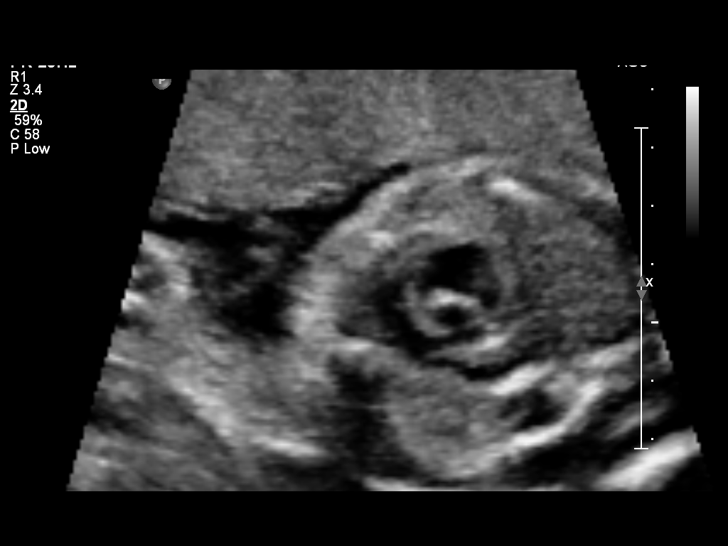
[im 62/62]
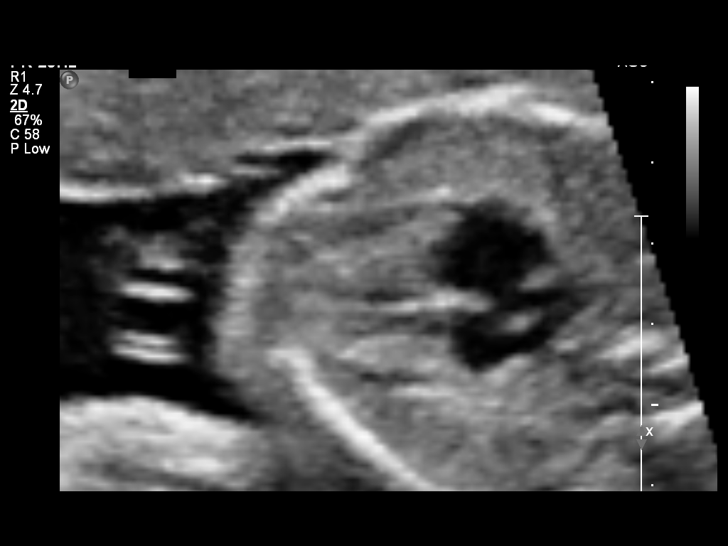

[Series 1: us ob follow up · 12 acquisitions, 2 frames shown (2 of 2)]
[im 3/12]
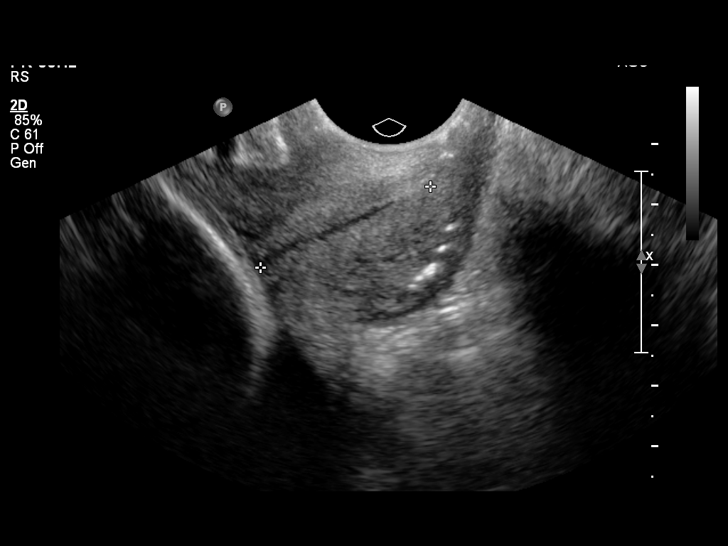
[im 9/12]
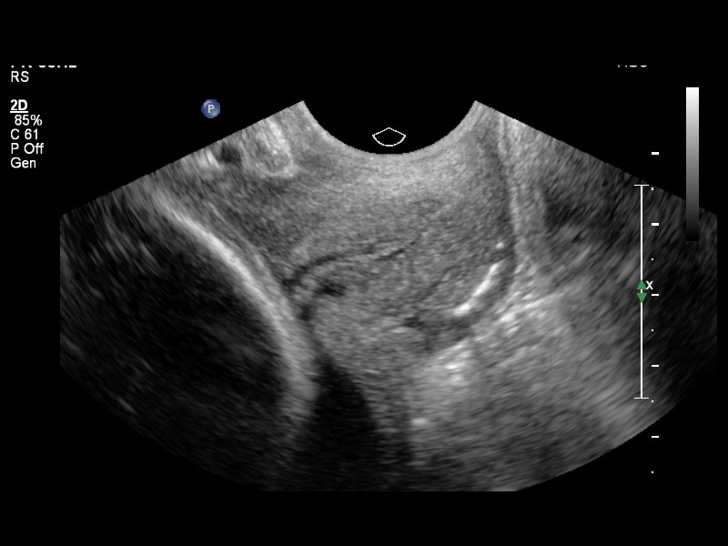

[12 of 28 positions shown; findings below may reference images not displayed]

OBSTETRICS REPORT
                    (Corrected Final 09/20/2014 [DATE])

Service(s) Provided

 US OB FOLLOW UP                                       76816.1
 US MFM OB TRANSVAGINAL                                76817.2
Indications

 Follow-up incomplete fetal anatomic evaluation        Z36
 26 weeks gestation of pregnancy
Fetal Evaluation

 Num Of Fetuses:    1
 Fetal Heart Rate:  152                          bpm
 Cardiac Activity:  Observed
 Presentation:      Cephalic
 Placenta:          Anterior, above cervical os
 P. Cord            Previously Visualized
 Insertion:

 Amniotic Fluid
 AFI FV:      Subjectively within normal limits
                                             Larg Pckt:    4.53  cm
Biometry

 BPD:       66  mm     G. Age:  26w 4d                CI:        74.72   70 - 86
                                                      FL/HC:      20.1   18.6 -

 HC:     242.3  mm     G. Age:  26w 2d       31  %    HC/AC:      1.13   1.04 -

 AC:     214.8  mm     G. Age:  26w 0d       37  %    FL/BPD:     73.9   71 - 87
 FL:      48.8  mm     G. Age:  26w 3d       44  %    FL/AC:      22.7   20 - 24
 HUM:     44.6  mm     G. Age:  26w 3d       54  %

 Est. FW:     904  gm           2 lb     55  %
Gestational Age

 LMP:           27w 0d        Date:  03/14/14                 EDD:   12/19/14
 U/S Today:     26w 2d                                        EDD:   12/24/14
 Best:          26w 1d     Det. By:  U/S (08/01/14)           EDD:   12/25/14
Anatomy

 Cranium:          Appears normal         Aortic Arch:      Previously seen
 Fetal Cavum:      Previously seen        Ductal Arch:      Previously seen
 Ventricles:       Appears normal         Diaphragm:        Appears normal
 Choroid Plexus:   Previously seen        Stomach:          Appears normal, left
                                                            sided
 Cerebellum:       Previously seen        Abdomen:          Appears normal
 Posterior Fossa:  Previously seen        Abdominal Wall:   Previously seen
 Nuchal Fold:      Not applicable (>20    Cord Vessels:     Previously seen
                   wks GA)
 Face:             Orbits and profile     Kidneys:          Appear normal
                   previously seen
 Lips:             Appears normal         Bladder:          Appears normal
 Heart:            Not well visualized    Spine:            Previously seen
 RVOT:             Appears normal         Lower             Previously seen
                                          Extremities:
 LVOT:             Appears normal         Upper             Previously seen
                                          Extremities:

 Other:  Fetus appears to be a male. Heels previously visualized. Technically
         difficult due to maternal habitus and fetal position.
Targeted Anatomy

 Fetal Central Nervous System
 Lat. Ventricles:
Cervix Uterus Adnexa

 Cervical Length:    3.29     cm

 Cervix:       Normal appearance by transvaginal scan
 Uterus:       No abnormality visualized.
 Left Ovary:    Not visualized.
 Right Ovary:   Not visualized.
 Adnexa:     No abnormality visualized. No adnexal mass visualized.
Impression

 Single IUP at 26w 1d
 Normal interval anatomy
 Somewhat limited views of the fetal heart were again
 obtained (4CH)
 Fetal growth is appropriate (55th %tile)
 Anterior placenta without previa
 Normal amniotic fluid volume
Recommendations

 Recommend follow-up ultrasound examination in 4 weeks for
 growth and to complete anatomy

                 Attending Physician, BOLTU

## 2014-09-25 ENCOUNTER — Other Ambulatory Visit (HOSPITAL_COMMUNITY): Payer: Self-pay | Admitting: Nurse Practitioner

## 2014-09-25 DIAGNOSIS — IMO0002 Reserved for concepts with insufficient information to code with codable children: Secondary | ICD-10-CM

## 2014-09-25 DIAGNOSIS — Z0489 Encounter for examination and observation for other specified reasons: Secondary | ICD-10-CM

## 2014-10-17 ENCOUNTER — Ambulatory Visit (HOSPITAL_COMMUNITY)
Admission: RE | Admit: 2014-10-17 | Discharge: 2014-10-17 | Disposition: A | Discharge status: Home / Self Care | Payer: Medicaid Other | Source: Ambulatory Visit | Attending: Nurse Practitioner | Admitting: Nurse Practitioner

## 2014-10-17 DIAGNOSIS — Z36 Encounter for antenatal screening of mother: Secondary | ICD-10-CM | POA: Insufficient documentation

## 2014-10-17 DIAGNOSIS — Z0489 Encounter for examination and observation for other specified reasons: Secondary | ICD-10-CM | POA: Insufficient documentation

## 2014-10-17 DIAGNOSIS — Z3A30 30 weeks gestation of pregnancy: Secondary | ICD-10-CM | POA: Insufficient documentation

## 2014-10-17 DIAGNOSIS — IMO0002 Reserved for concepts with insufficient information to code with codable children: Secondary | ICD-10-CM | POA: Insufficient documentation

## 2014-10-17 IMAGING — US US OB FOLLOW-UP
1 series · 12 of 28 positions shown · non-contrast
Comparison: none

[Series 1: us ob follow-up · 12 of 52 slices shown]
[im 2/52]
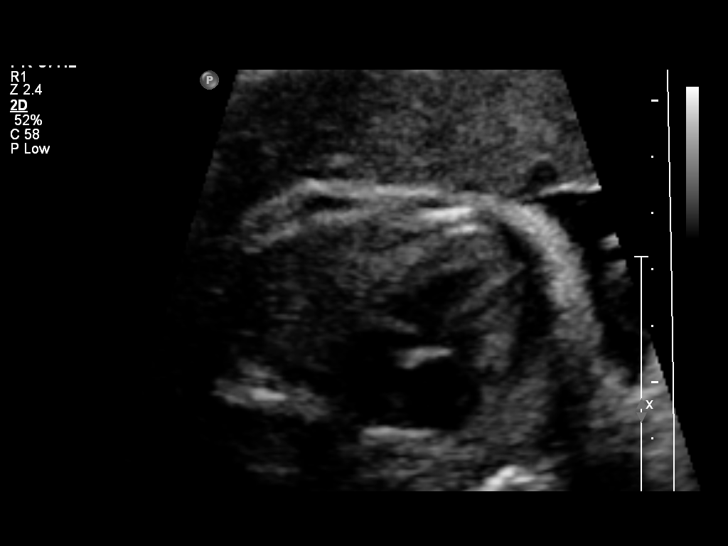
[im 6/52]
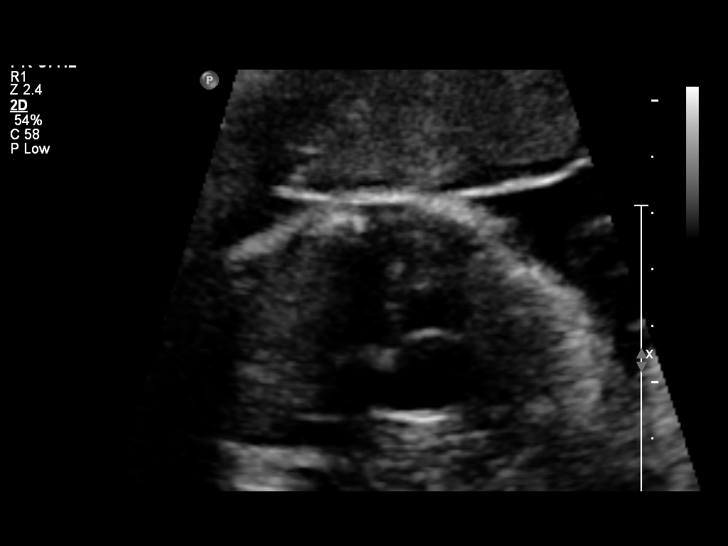
[im 10/52]
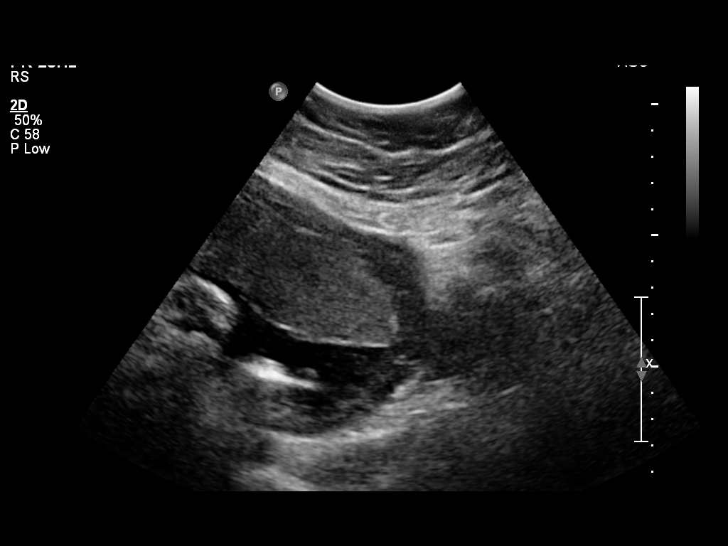
[im 16/52]
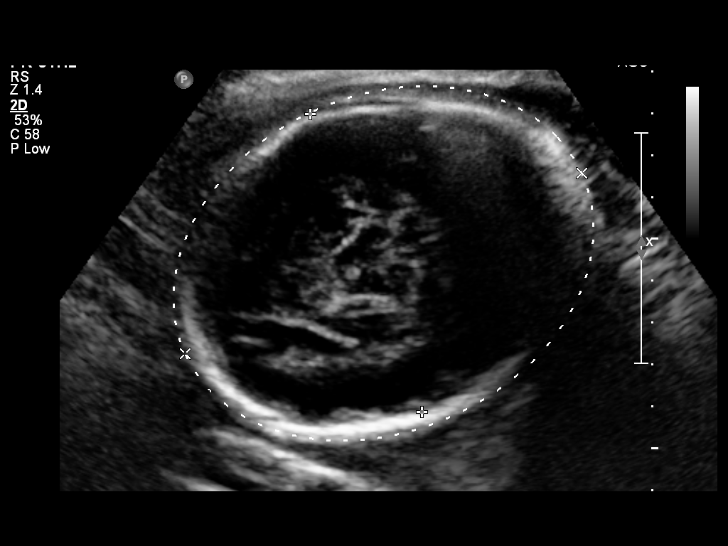
[im 19/52]
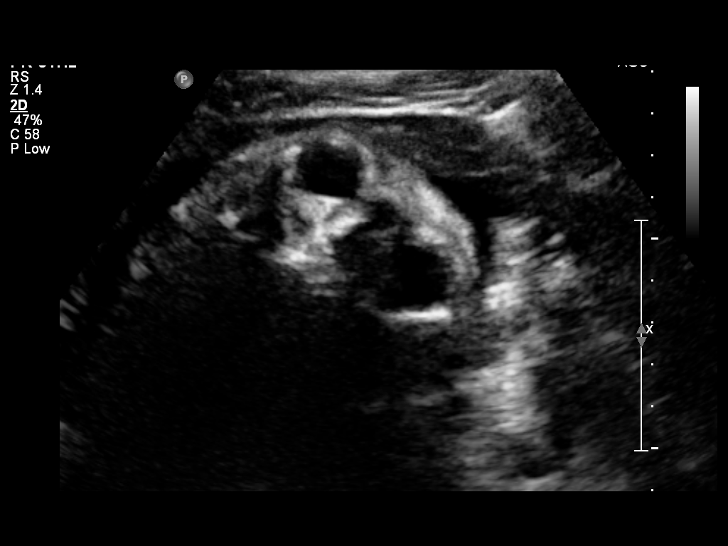
[im 23/52]
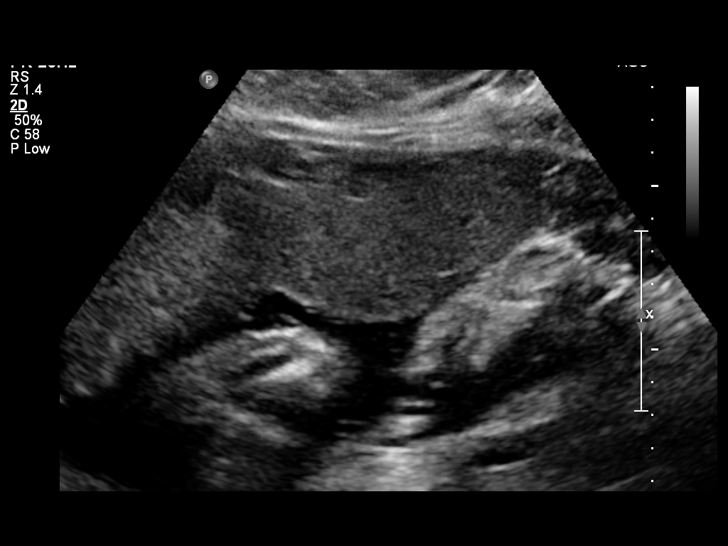
[im 29/52]
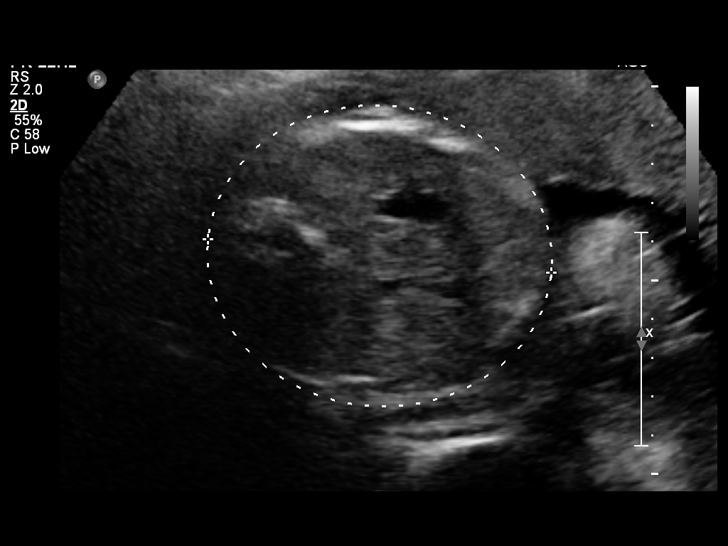
[im 33/52]
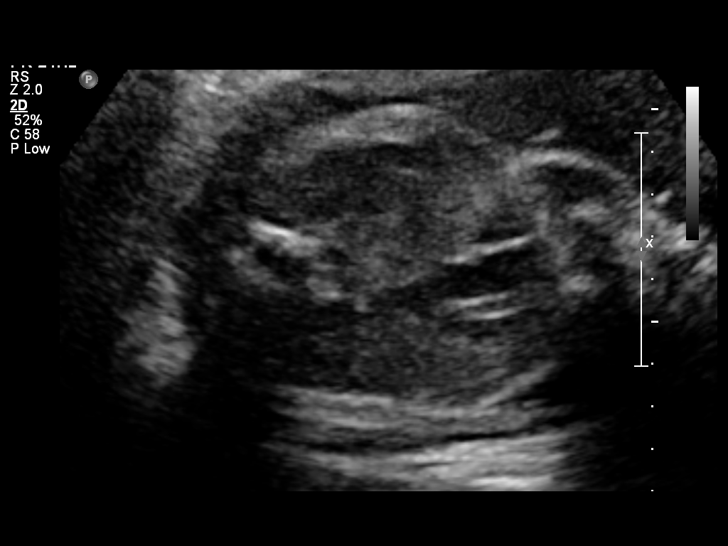
[im 36/52]
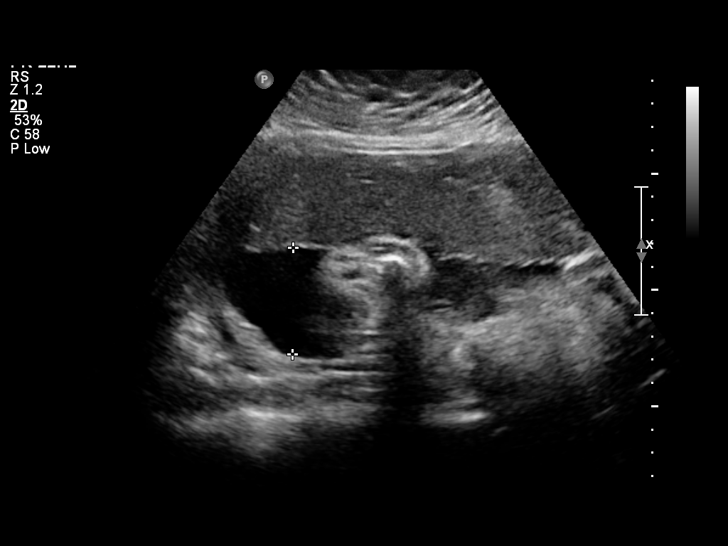
[im 42/52]
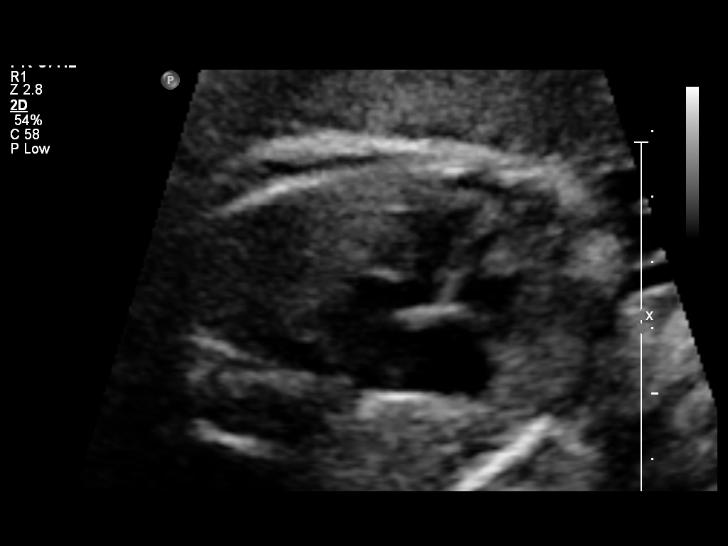
[im 46/52]
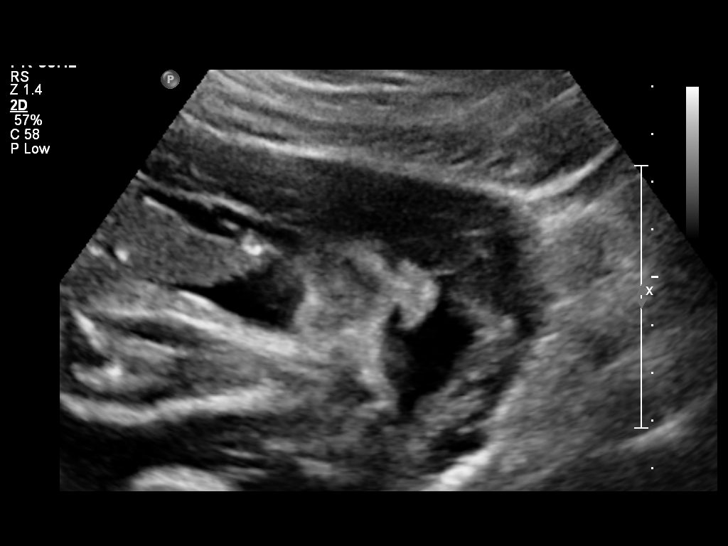
[im 50/52]
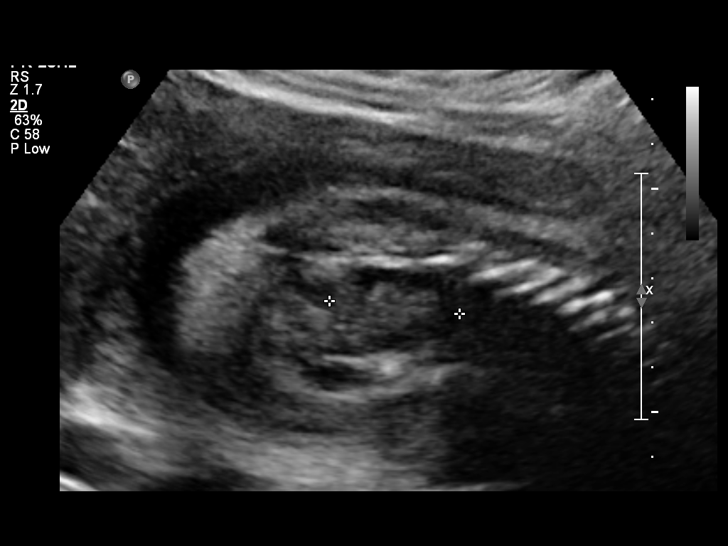

[12 of 28 positions shown; findings below may reference images not displayed]

OBSTETRICS REPORT
                      (Signed Final 10/17/2014 [DATE])

Service(s) Provided

 US OB FOLLOW UP                                       76816.1
Indications

 30 weeks gestation of pregnancy
 Obesity complicating pregnancy, third trimester
Fetal Evaluation

 Num Of Fetuses:    1
 Fetal Heart Rate:  140                          bpm
 Cardiac Activity:  Observed
 Presentation:      Cephalic
 Placenta:          Anterior, above cervical os
 P. Cord            Previously Visualized
 Insertion:

 Amniotic Fluid
 AFI FV:      Subjectively low-normal
 AFI Sum:     11.75   cm       28  %Tile     Larg Pckt:    4.56  cm
 RUQ:   4.56    cm   RLQ:    2.41   cm    LUQ:   2.89    cm   LLQ:    1.89   cm
Biometry

 BPD:     76.3  mm     G. Age:  30w 4d                CI:        70.59   70 - 86
                                                      FL/HC:      20.6   19.2 -

 HC:     289.5  mm     G. Age:  31w 6d       65  %    HC/AC:      1.12   0.99 -

 AC:     259.5  mm     G. Age:  30w 1d       44  %    FL/BPD:     78.0   71 - 87
 FL:      59.5  mm     G. Age:  31w 0d       60  %    FL/AC:      22.9   20 - 24

 Est. FW:    4288  gm      3 lb 9 oz     62  %
Gestational Age

 LMP:           31w 0d        Date:  03/14/14                 EDD:   12/19/14
 U/S Today:     30w 6d                                        EDD:   12/20/14
 Best:          30w 1d     Det. By:  U/S (08/01/14)           EDD:   12/25/14
Anatomy

 Cranium:          Appears normal         Aortic Arch:      Previously seen
 Fetal Cavum:      Previously seen        Ductal Arch:      Previously seen
 Ventricles:       Appears normal         Diaphragm:        Appears normal
 Choroid Plexus:   Previously seen        Stomach:          Appears normal, left
                                                            sided
 Cerebellum:       Previously seen        Abdomen:          Appears normal
 Posterior Fossa:  Previously seen        Abdominal Wall:   Previously seen
 Nuchal Fold:      Not applicable (>20    Cord Vessels:     Previously seen
                   wks GA)
 Face:             Orbits and profile     Kidneys:          Appear normal
                   previously seen
 Lips:             Appears normal         Bladder:          Appears normal
 Heart:            Appears normal         Spine:            Previously seen
                   (4CH, axis, and
                   situs)
 RVOT:             Previously seen        Lower             Previously seen
                                          Extremities:
 LVOT:             Appears normal         Upper             Previously seen
                                          Extremities:

 Other:  Fetus appears to be a male. Heels previously visualized. Technically
         difficult due to maternal habitus and fetal position.
Cervix Uterus Adnexa

 Cervix:       Not visualized (advanced GA >00wks)

 Adnexa:     No abnormality visualized.
Impression

 Single IUP at 30w 1d
 Normal interval anatomy; the fetal anatomic survey is now
 complete
 Fetal growth is appropriate (62nd %tile)
 Anterior placenta
 Normal amniotic fluid volume
Recommendations

 Follow-up ultrasounds as clinically indicated.

## 2014-12-26 ENCOUNTER — Other Ambulatory Visit (HOSPITAL_COMMUNITY): Payer: Self-pay | Admitting: Nurse Practitioner

## 2014-12-30 ENCOUNTER — Ambulatory Visit (HOSPITAL_COMMUNITY)
Admission: RE | Admit: 2014-12-30 | Discharge: 2014-12-30 | Disposition: A | Discharge status: Home / Self Care | Payer: Medicaid Other | Source: Ambulatory Visit | Attending: Family Medicine | Admitting: Family Medicine

## 2014-12-30 ENCOUNTER — Inpatient Hospital Stay (HOSPITAL_COMMUNITY)
Admission: AD | Admit: 2014-12-30 | Discharge: 2014-12-30 | Disposition: A | Discharge status: Home / Self Care | Payer: Medicaid Other | Source: Ambulatory Visit | Attending: Obstetrics and Gynecology | Admitting: Obstetrics and Gynecology

## 2014-12-30 ENCOUNTER — Inpatient Hospital Stay (HOSPITAL_COMMUNITY)
Admission: AD | Admit: 2014-12-30 | Discharge: 2014-12-30 | Disposition: A | Discharge status: Home / Self Care | Payer: Medicaid Other | Source: Ambulatory Visit | Attending: Family Medicine | Admitting: Family Medicine

## 2014-12-30 ENCOUNTER — Encounter (HOSPITAL_COMMUNITY): Payer: Self-pay | Admitting: *Deleted

## 2014-12-30 ENCOUNTER — Other Ambulatory Visit (HOSPITAL_COMMUNITY): Payer: Self-pay | Admitting: Nurse Practitioner

## 2014-12-30 DIAGNOSIS — O99214 Obesity complicating childbirth: Secondary | ICD-10-CM | POA: Diagnosis present

## 2014-12-30 DIAGNOSIS — O99824 Streptococcus B carrier state complicating childbirth: Secondary | ICD-10-CM | POA: Diagnosis present

## 2014-12-30 DIAGNOSIS — O480 Post-term pregnancy: Secondary | ICD-10-CM | POA: Insufficient documentation

## 2014-12-30 DIAGNOSIS — Z6841 Body Mass Index (BMI) 40.0 and over, adult: Secondary | ICD-10-CM | POA: Diagnosis not present

## 2014-12-30 DIAGNOSIS — Z3A40 40 weeks gestation of pregnancy: Secondary | ICD-10-CM

## 2014-12-30 DIAGNOSIS — O99213 Obesity complicating pregnancy, third trimester: Secondary | ICD-10-CM

## 2014-12-30 IMAGING — US US FETAL BPP W/O NONSTRESS
1 series · 14 of 28 positions shown · non-contrast
Comparison: none

[Series 1: us ob follow up · 32 acquisitions, 14 frames shown]
[im 2/32]
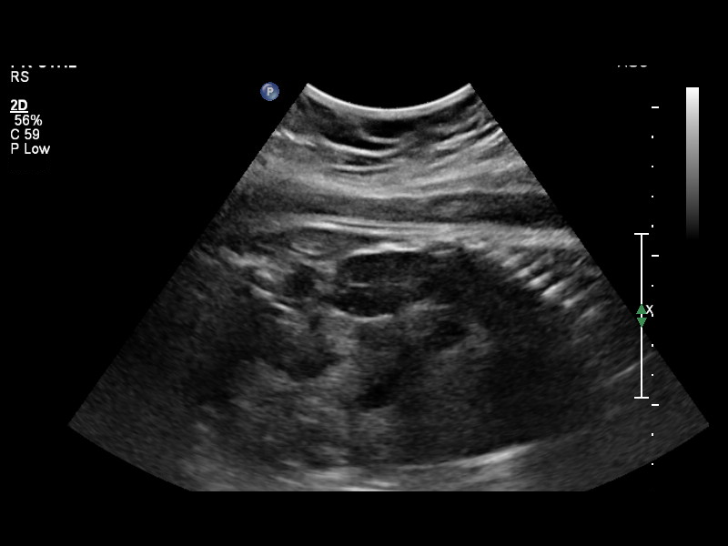
[im 4/32]
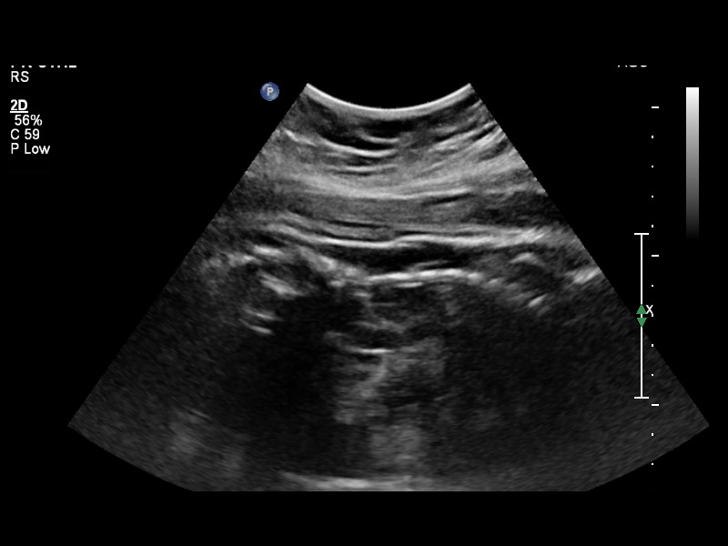
[im 6/32]
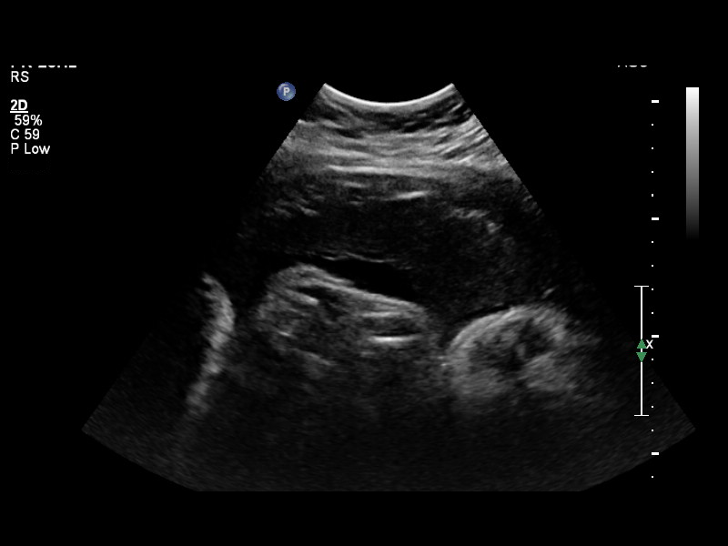
[im 9/32]
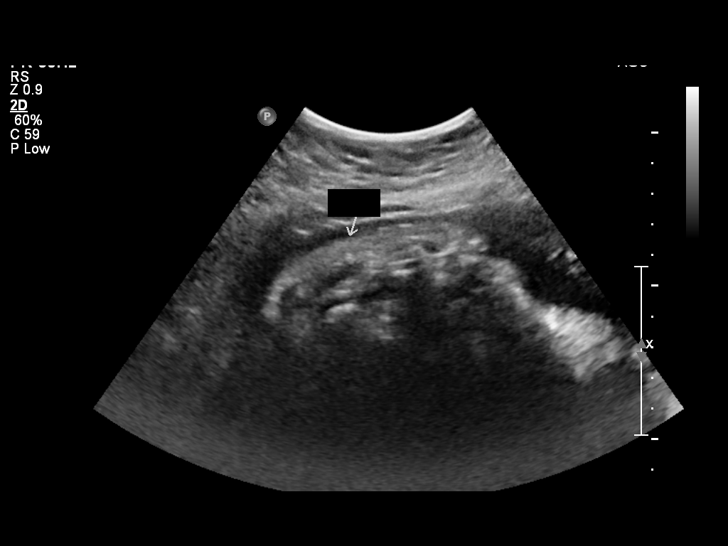
[im 11/32]
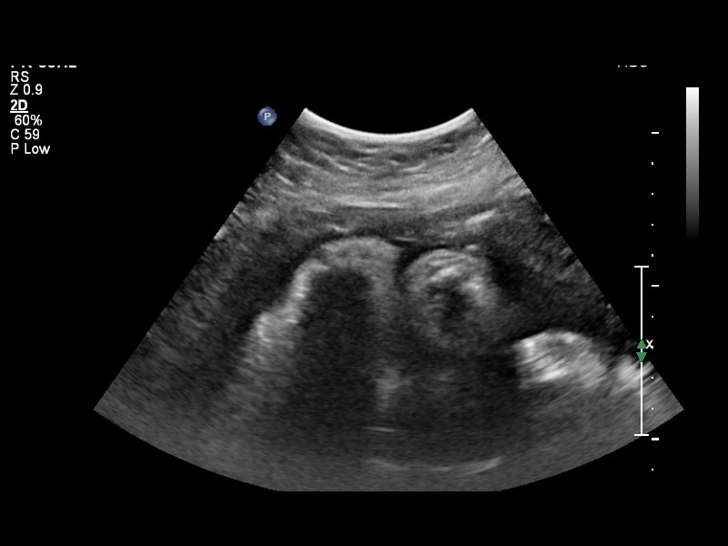
[im 13/32]
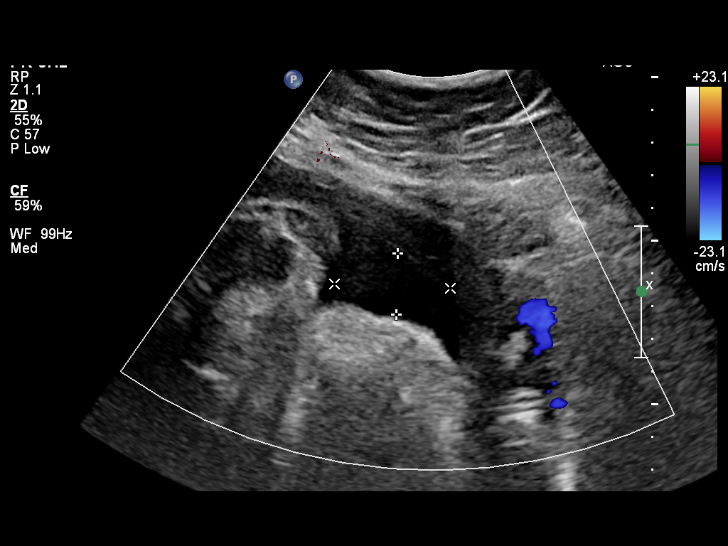
[im 15/32]
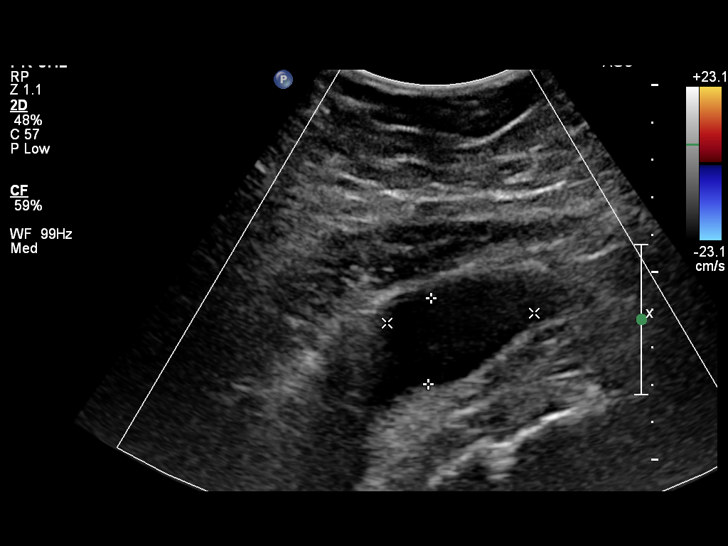
[im 18/32]
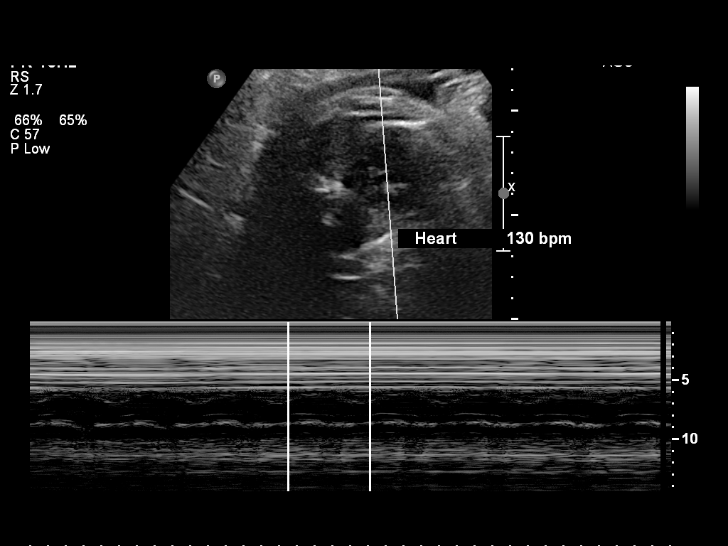
[im 20/32]
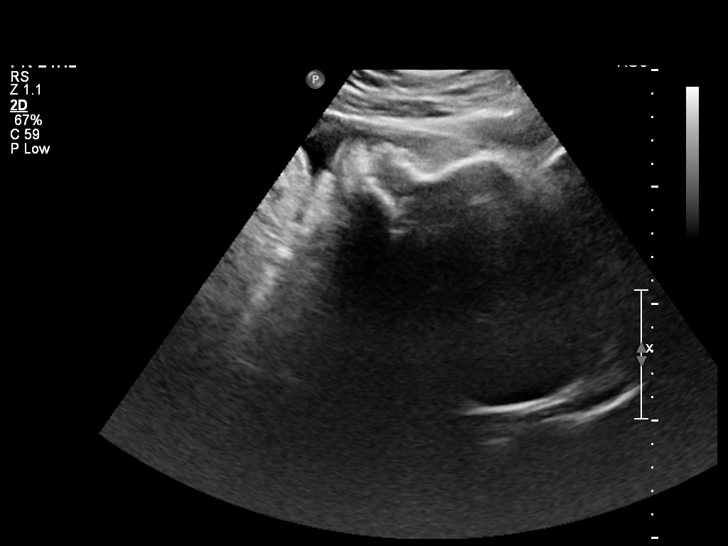
[im 22/32]
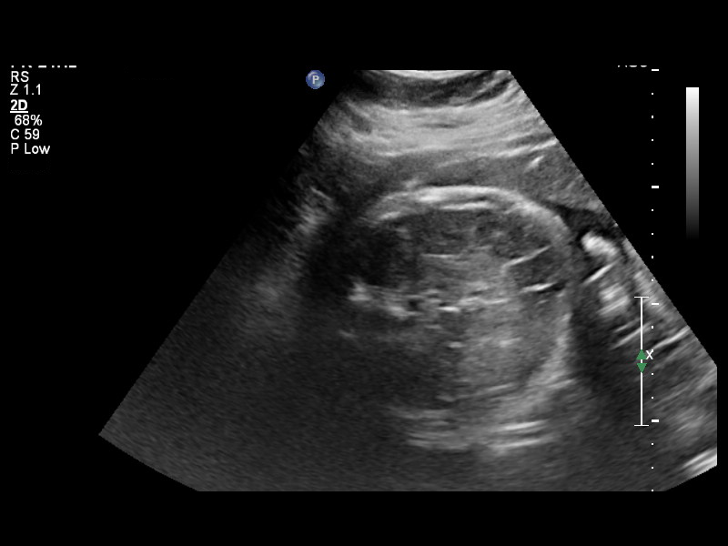
[im 25/32]
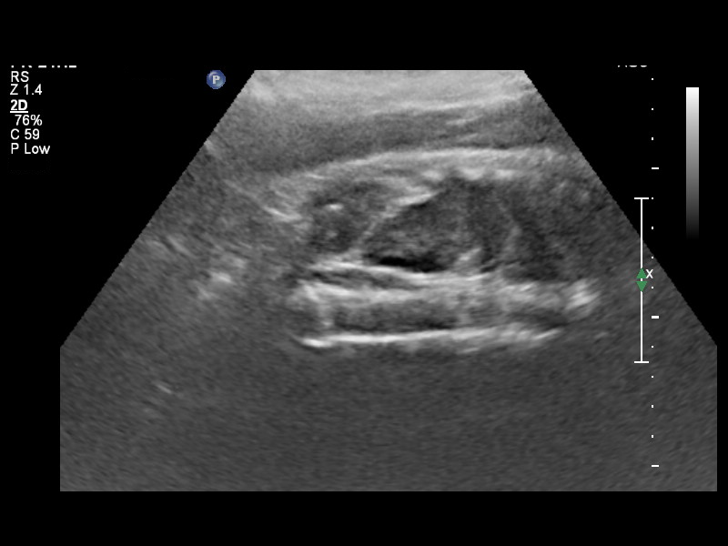
[im 27/32]
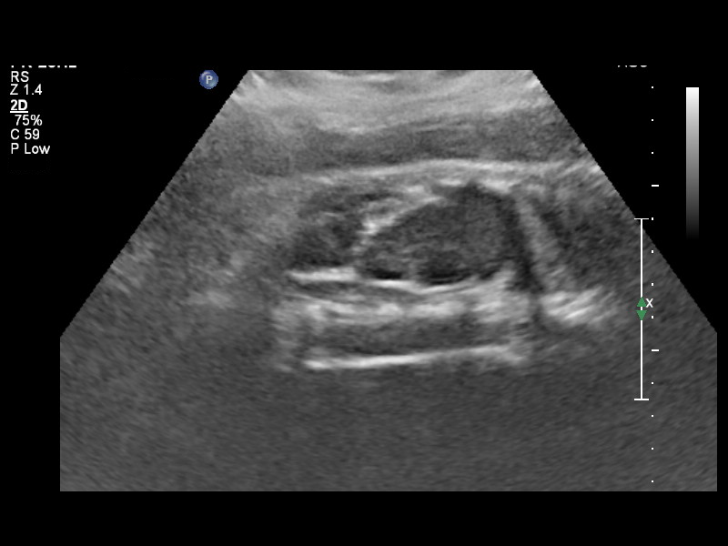
[im 29/32]
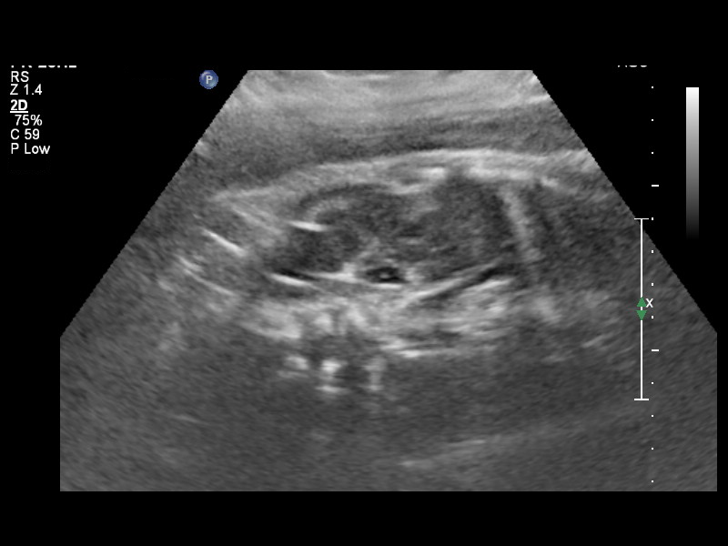
[im 32/32]
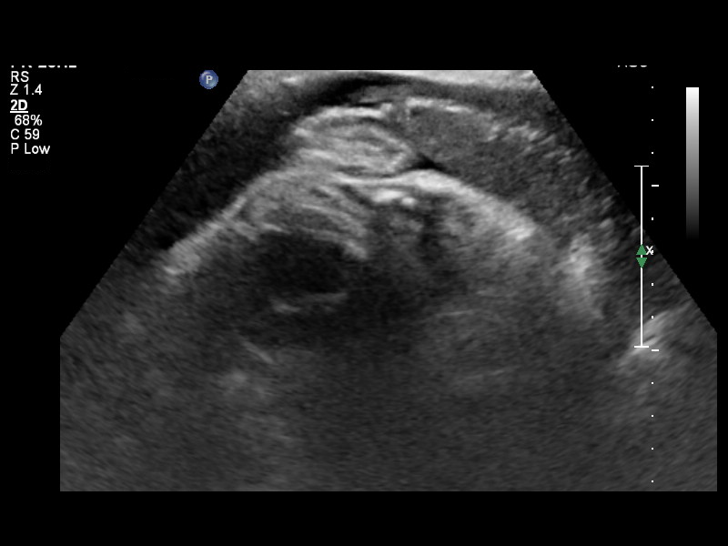

[14 of 28 positions shown; findings below may reference images not displayed]

OBSTETRICS REPORT
(Signed Final 12/30/2014 [DATE])

Service(s) Provided

Indications

Obesity complicating pregnancy, third trimester
40 weeks gestation of pregnancy
Postdate pregnancy (40-42 weeks)
Fetal Evaluation

Num Of Fetuses:    1
Fetal Heart Rate:  130                          bpm
Cardiac Activity:  Observed
Presentation:      Cephalic

Amniotic Fluid
AFI FV:      Subjectively within normal limits
AFI Sum:     7.04    cm        5  %Tile     Larg Pckt:    2.89  cm
RLQ:   2.29    cm   LUQ:    2.89   cm    LLQ:   1.86    cm
Biophysical Evaluation

Amniotic F.V:   Pocket => 2 cm two         F. Tone:        Observed
planes
F. Movement:    Observed                   Score:          [DATE]
F. Breathing:   Observed
Gestational Age

LMP:           41w 4d        Date:  03/14/14                 EDD:   12/19/14
Best:          40w 5d     Det. By:  U/S (08/01/14)           EDD:   12/25/14
Impression

Single IUP at 40w 5d (remote read only)
BPP [DATE]
Anterior placenta
Normal amniotic fluid volume

Recommendations

Continue antenatal testing until delivery, noting I recommend
arranging delivery prior to 42 weeks (when feasible in the
next week).

## 2014-12-30 NOTE — MAU Note (Signed)
Pt reports back pain since earlier today, contractions that started at 2230. Denies bleeding or ROM.

## 2014-12-30 NOTE — Progress Notes (Signed)
Will recheck at one hour and call with results.

## 2014-12-30 NOTE — MAU Note (Signed)
Urine in lab 

## 2014-12-30 NOTE — MAU Note (Signed)
Pt states here for contractions q3 minutes apart. Was seen in MAU early this am. Cervix 1cm. Bloody show noted. No gush of fluid.

## 2014-12-30 NOTE — Discharge Instructions (Signed)
Third Trimester of Pregnancy The third trimester is from week 29 through week 42, months 7 through 9. This trimester is when your unborn baby (fetus) is growing very fast. At the end of the ninth month, the unborn baby is about 20 inches in length. It weighs about 6-10 pounds.  HOME CARE   Avoid all smoking, herbs, and alcohol. Avoid drugs not approved by your doctor.  Only take medicine as told by your doctor. Some medicines are safe and some are not during pregnancy.  Exercise only as told by your doctor. Stop exercising if you start having cramps.  Eat regular, healthy meals.  Wear a good support bra if your breasts are tender.  Do not use hot tubs, steam rooms, or saunas.  Wear your seat belt when driving.  Avoid raw meat, uncooked cheese, and liter boxes and soil used by cats.  Take your prenatal vitamins.  Try taking medicine that helps you poop (stool softener) as needed, and if your doctor approves. Eat more fiber by eating fresh fruit, vegetables, and whole grains. Drink enough fluids to keep your pee (urine) clear or pale yellow.  Take warm water baths (sitz baths) to soothe pain or discomfort caused by hemorrhoids. Use hemorrhoid cream if your doctor approves.  If you have puffy, bulging veins (varicose veins), wear support hose. Raise (elevate) your feet for 15 minutes, 3-4 times a day. Limit salt in your diet.  Avoid heavy lifting, wear low heels, and sit up straight.  Rest with your legs raised if you have leg cramps or low back pain.  Visit your dentist if you have not gone during your pregnancy. Use a soft toothbrush to brush your teeth. Be gentle when you floss.  You can have sex (intercourse) unless your doctor tells you not to.  Do not travel far distances unless you must. Only do so with your doctor's approval.  Take prenatal classes.  Practice driving to the hospital.  Pack your hospital bag.  Prepare the baby's room.  Go to your doctor visits. GET  HELP IF:  You are not sure if you are in labor or if your water has broken.  You are dizzy.  You have mild cramps or pressure in your lower belly (abdominal).  You have a nagging pain in your belly area.  You continue to feel sick to your stomach (nauseous), throw up (vomit), or have watery poop (diarrhea).  You have bad smelling fluid coming from your vagina.  You have pain with peeing (urination). GET HELP RIGHT AWAY IF:   You have a fever.  You are leaking fluid from your vagina.  You are spotting or bleeding from your vagina.  You have severe belly cramping or pain.  You lose or gain weight rapidly.  You have trouble catching your breath and have chest pain.  You notice sudden or extreme puffiness (swelling) of your face, hands, ankles, feet, or legs.  You have not felt the baby move in over an hour.  You have severe headaches that do not go away with medicine.  You have vision changes. Document Released: 11/10/2009 Document Revised: 12/11/2012 Document Reviewed: 10/17/2012 Exit[employer] Patient Information 2015 [employer]. This information is not intended to replace advice given to you by your health care provider. Make sure you discuss any questions you have with your health care provider.  

## 2014-12-31 ENCOUNTER — Inpatient Hospital Stay (HOSPITAL_COMMUNITY): Payer: Medicaid Other | Admitting: Anesthesiology

## 2014-12-31 ENCOUNTER — Inpatient Hospital Stay (HOSPITAL_COMMUNITY)
Admission: AD | Admit: 2014-12-31 | Discharge: 2015-01-01 | DRG: 775 | Disposition: A | Discharge status: Home / Self Care | Payer: Medicaid Other | Source: Ambulatory Visit | Attending: Family Medicine | Admitting: Family Medicine

## 2014-12-31 ENCOUNTER — Encounter (HOSPITAL_COMMUNITY): Payer: Self-pay | Admitting: *Deleted

## 2014-12-31 DIAGNOSIS — O99824 Streptococcus B carrier state complicating childbirth: Secondary | ICD-10-CM | POA: Diagnosis not present

## 2014-12-31 DIAGNOSIS — IMO0001 Reserved for inherently not codable concepts without codable children: Secondary | ICD-10-CM

## 2014-12-31 DIAGNOSIS — Z6841 Body Mass Index (BMI) 40.0 and over, adult: Secondary | ICD-10-CM | POA: Diagnosis not present

## 2014-12-31 DIAGNOSIS — Z3A40 40 weeks gestation of pregnancy: Secondary | ICD-10-CM | POA: Diagnosis present

## 2014-12-31 DIAGNOSIS — O9989 Other specified diseases and conditions complicating pregnancy, childbirth and the puerperium: Secondary | ICD-10-CM | POA: Diagnosis present

## 2014-12-31 DIAGNOSIS — O99214 Obesity complicating childbirth: Secondary | ICD-10-CM | POA: Diagnosis not present

## 2014-12-31 HISTORY — DX: Unspecified asthma, uncomplicated: J45.909

## 2014-12-31 LAB — CBC
HCT: 33.9 % — ABNORMAL LOW (ref 36.0–46.0)
Hemoglobin: 11 g/dL — ABNORMAL LOW (ref 12.0–15.0)
MCH: 21.4 pg — AB (ref 26.0–34.0)
MCHC: 32.4 g/dL (ref 30.0–36.0)
MCV: 66 fL — ABNORMAL LOW (ref 78.0–100.0)
PLATELETS: 279 10*3/uL (ref 150–400)
RBC: 5.14 MIL/uL — ABNORMAL HIGH (ref 3.87–5.11)
RDW: 14.9 % (ref 11.5–15.5)
WBC: 7.6 10*3/uL (ref 4.0–10.5)

## 2014-12-31 LAB — HIV ANTIBODY (ROUTINE TESTING W REFLEX): HIV Screen 4th Generation wRfx: NONREACTIVE

## 2014-12-31 LAB — ABO/RH: ABO/RH(D): B POS

## 2014-12-31 LAB — RPR: RPR Ser Ql: NONREACTIVE

## 2014-12-31 LAB — TYPE AND SCREEN
ABO/RH(D): B POS
ANTIBODY SCREEN: NEGATIVE

## 2014-12-31 MED ORDER — ONDANSETRON HCL 4 MG/2ML IJ SOLN: 4.0000 mg | Freq: Four times a day (QID) | INTRAMUSCULAR | Status: DC | PRN

## 2014-12-31 MED ORDER — OXYCODONE-ACETAMINOPHEN 5-325 MG PO TABS: 1.0000 | ORAL_TABLET | ORAL | Status: DC | PRN

## 2014-12-31 MED ORDER — PHENYLEPHRINE 40 MCG/ML (10ML) SYRINGE FOR IV PUSH (FOR BLOOD PRESSURE SUPPORT)
80.0000 ug | PREFILLED_SYRINGE | INTRAVENOUS | Status: DC | PRN
  Filled 2014-12-31: qty 20

## 2014-12-31 MED ORDER — ACETAMINOPHEN 325 MG PO TABS: 650.0000 mg | ORAL_TABLET | ORAL | Status: DC | PRN

## 2014-12-31 MED ORDER — SODIUM CHLORIDE 0.9 % IV SOLN
2.0000 g | Freq: Once | INTRAVENOUS | Status: AC
  Administered 2014-12-31: 2 g via INTRAVENOUS
  Filled 2014-12-31: qty 2000

## 2014-12-31 MED ORDER — OXYTOCIN 40 UNITS IN LACTATED RINGERS INFUSION - SIMPLE MED
62.5000 mL/h | INTRAVENOUS | Status: DC
  Administered 2014-12-31: 62.5 mL/h via INTRAVENOUS

## 2014-12-31 MED ORDER — OXYCODONE-ACETAMINOPHEN 5-325 MG PO TABS: 2.0000 | ORAL_TABLET | ORAL | Status: DC | PRN

## 2014-12-31 MED ORDER — OXYTOCIN BOLUS FROM INFUSION
500.0000 mL | INTRAVENOUS | Status: DC
  Administered 2014-12-31: 500 mL via INTRAVENOUS

## 2014-12-31 MED ORDER — DIPHENHYDRAMINE HCL 25 MG PO CAPS: 25.0000 mg | ORAL_CAPSULE | Freq: Four times a day (QID) | ORAL | Status: DC | PRN

## 2014-12-31 MED ORDER — IBUPROFEN 600 MG PO TABS
600.0000 mg | ORAL_TABLET | Freq: Four times a day (QID) | ORAL | Status: DC
  Administered 2014-12-31 – 2015-01-01 (×6): 600 mg via ORAL
  Filled 2014-12-31 (×6): qty 1

## 2014-12-31 MED ORDER — PRENATAL MULTIVITAMIN CH
1.0000 | ORAL_TABLET | Freq: Every day | ORAL | Status: DC
  Administered 2015-01-01: 1 via ORAL
  Filled 2014-12-31: qty 1

## 2014-12-31 MED ORDER — SENNOSIDES-DOCUSATE SODIUM 8.6-50 MG PO TABS
2.0000 | ORAL_TABLET | ORAL | Status: DC
  Administered 2015-01-01: 2 via ORAL
  Filled 2014-12-31: qty 2

## 2014-12-31 MED ORDER — FENTANYL 2.5 MCG/ML BUPIVACAINE 1/10 % EPIDURAL INFUSION (WH - ANES)
14.0000 mL/h | INTRAMUSCULAR | Status: DC | PRN
  Administered 2014-12-31: 14 mL/h via EPIDURAL
  Filled 2014-12-31: qty 125

## 2014-12-31 MED ORDER — LIDOCAINE HCL (PF) 1 % IJ SOLN
30.0000 mL | INTRAMUSCULAR | Status: DC | PRN
  Filled 2014-12-31: qty 30

## 2014-12-31 MED ORDER — FLEET ENEMA 7-19 GM/118ML RE ENEM: 1.0000 | ENEMA | RECTAL | Status: DC | PRN

## 2014-12-31 MED ORDER — ONDANSETRON HCL 4 MG PO TABS: 4.0000 mg | ORAL_TABLET | ORAL | Status: DC | PRN

## 2014-12-31 MED ORDER — LIDOCAINE HCL (PF) 1 % IJ SOLN
INTRAMUSCULAR | Status: DC | PRN
  Administered 2014-12-31: 6 mL
  Administered 2014-12-31: 4 mL

## 2014-12-31 MED ORDER — LACTATED RINGERS IV SOLN: 500.0000 mL | INTRAVENOUS | Status: DC | PRN

## 2014-12-31 MED ORDER — FENTANYL CITRATE (PF) 100 MCG/2ML IJ SOLN: 100.0000 ug | INTRAMUSCULAR | Status: DC | PRN

## 2014-12-31 MED ORDER — LACTATED RINGERS IV SOLN
INTRAVENOUS | Status: DC
  Administered 2014-12-31: 02:00:00 via INTRAVENOUS

## 2014-12-31 MED ORDER — LACTATED RINGERS IV SOLN
INTRAVENOUS | Status: DC
  Administered 2014-12-31: 03:00:00 via INTRAVENOUS

## 2014-12-31 MED ORDER — BENZOCAINE-MENTHOL 20-0.5 % EX AERO
1.0000 "application " | INHALATION_SPRAY | CUTANEOUS | Status: DC | PRN
  Filled 2014-12-31 (×2): qty 56

## 2014-12-31 MED ORDER — OXYTOCIN BOLUS FROM INFUSION: 500.0000 mL | INTRAVENOUS | Status: DC

## 2014-12-31 MED ORDER — OXYTOCIN 40 UNITS IN LACTATED RINGERS INFUSION - SIMPLE MED
1.0000 m[IU]/min | INTRAVENOUS | Status: DC
  Administered 2014-12-31: 2 m[IU]/min via INTRAVENOUS

## 2014-12-31 MED ORDER — LANOLIN HYDROUS EX OINT: TOPICAL_OINTMENT | CUTANEOUS | Status: DC | PRN

## 2014-12-31 MED ORDER — WITCH HAZEL-GLYCERIN EX PADS: 1.0000 "application " | MEDICATED_PAD | CUTANEOUS | Status: DC | PRN

## 2014-12-31 MED ORDER — SIMETHICONE 80 MG PO CHEW: 80.0000 mg | CHEWABLE_TABLET | ORAL | Status: DC | PRN

## 2014-12-31 MED ORDER — ONDANSETRON HCL 4 MG/2ML IJ SOLN: 4.0000 mg | INTRAMUSCULAR | Status: DC | PRN

## 2014-12-31 MED ORDER — CITRIC ACID-SODIUM CITRATE 334-500 MG/5ML PO SOLN: 30.0000 mL | ORAL | Status: DC | PRN

## 2014-12-31 MED ORDER — LACTATED RINGERS IV SOLN
500.0000 mL | INTRAVENOUS | Status: DC | PRN
  Administered 2014-12-31: 1000 mL via INTRAVENOUS

## 2014-12-31 MED ORDER — EPHEDRINE 5 MG/ML INJ: 10.0000 mg | INTRAVENOUS | Status: DC | PRN

## 2014-12-31 MED ORDER — DIPHENHYDRAMINE HCL 50 MG/ML IJ SOLN: 12.5000 mg | INTRAMUSCULAR | Status: DC | PRN

## 2014-12-31 MED ORDER — TERBUTALINE SULFATE 1 MG/ML IJ SOLN: 0.2500 mg | Freq: Once | INTRAMUSCULAR | Status: DC | PRN

## 2014-12-31 MED ORDER — FENTANYL 2.5 MCG/ML BUPIVACAINE 1/10 % EPIDURAL INFUSION (WH - ANES): 14.0000 mL/h | INTRAMUSCULAR | Status: DC | PRN

## 2014-12-31 MED ORDER — ACETAMINOPHEN 325 MG PO TABS
650.0000 mg | ORAL_TABLET | ORAL | Status: DC | PRN
Start: 2014-12-31 — End: 2014-12-31

## 2014-12-31 MED ORDER — OXYTOCIN 40 UNITS IN LACTATED RINGERS INFUSION - SIMPLE MED
62.5000 mL/h | INTRAVENOUS | Status: DC
  Filled 2014-12-31: qty 1000

## 2014-12-31 MED ORDER — DIBUCAINE 1 % RE OINT
1.0000 "application " | TOPICAL_OINTMENT | RECTAL | Status: DC | PRN
  Filled 2014-12-31: qty 28

## 2014-12-31 MED ORDER — ZOLPIDEM TARTRATE 5 MG PO TABS: 5.0000 mg | ORAL_TABLET | Freq: Every evening | ORAL | Status: DC | PRN

## 2014-12-31 MED ORDER — TETANUS-DIPHTH-ACELL PERTUSSIS 5-2.5-18.5 LF-MCG/0.5 IM SUSP
0.5000 mL | Freq: Once | INTRAMUSCULAR | Status: DC
  Filled 2014-12-31: qty 0.5

## 2014-12-31 NOTE — Anesthesia Preprocedure Evaluation (Addendum)
Anesthesia Evaluation  Patient identified by MRN, date of birth, ID band Patient awake    Reviewed: Allergy & Precautions, H&P , Patient's Chart, lab work & pertinent test results  Airway Mallampati: II  TM Distance: >3 FB Neck ROM: full    Dental  (+) Teeth Intact   Pulmonary asthma (rare inhaler use) ,  breath sounds clear to auscultation        Cardiovascular Rhythm:regular Rate:Normal     Neuro/Psych    GI/Hepatic   Endo/Other  Morbid obesity  Renal/GU      Musculoskeletal   Abdominal   Peds  Hematology   Anesthesia Other Findings       Reproductive/Obstetrics (+) Pregnancy                            Anesthesia Physical Anesthesia Plan  ASA: III  Anesthesia Plan: Epidural   Post-op Pain Management:    Induction:   Airway Management Planned:   Additional Equipment:   Intra-op Plan:   Post-operative Plan:   Informed Consent: I have reviewed the patients History and Physical, chart, labs and discussed the procedure including the risks, benefits and alternatives for the proposed anesthesia with the patient or authorized representative who has indicated his/her understanding and acceptance.   Dental Advisory Given  Plan Discussed with:   Anesthesia Plan Comments: (Labs checked- platelets confirmed with RN in room. Fetal heart tracing, per RN, reported to be stable enough for sitting procedure. Discussed epidural, and patient consents to the procedure:  included risk of possible headache,backache, failed block, allergic reaction, and nerve injury. This patient was asked if she had any questions or concerns before the procedure started.)        Anesthesia Quick Evaluation

## 2014-12-31 NOTE — MAU Note (Signed)
Pt seen earlier today and yesterday for contractions and has presented tonight with possible ROM and contractions via EMS. Pt states that baby is active and denies bleeding.

## 2014-12-31 NOTE — Anesthesia Procedure Notes (Signed)
Epidural Patient location during procedure: OB  Preanesthetic Checklist Completed: patient identified, site marked, surgical consent, pre-op evaluation, timeout performed, IV checked, risks and benefits discussed and monitors and equipment checked  Epidural Patient position: sitting Prep: site prepped and draped and DuraPrep Patient monitoring: continuous pulse ox and blood pressure Approach: midline Location: L3-L4 Injection technique: LOR air  Needle:  Needle type: Tuohy  Needle gauge: 17 G Needle length: 9 cm and 9 Needle insertion depth: 7 cm Catheter type: closed end flexible Catheter size: 19 Gauge Catheter at skin depth: 14 cm Test dose: negative  Assessment Events: blood not aspirated, injection not painful, no injection resistance, negative IV test and no paresthesia  Additional Notes Dosing of Epidural:  1st dose, through catheter .............................................  Xylocaine 40 mg  2nd dose, through catheter, after waiting 3 minutes.........Xylocaine 60 mg    ( 1% Xylo charted as a single dose in Epic Meds for ease of charting; actual dosing was fractionated as above, for saftey's sake)  As each dose occurred, patient was free of IV sx; and patient exhibited no evidence of SA injection.  Patient is more comfortable after epidural dosed. Please see RN's note for documentation of vital signs,and FHR which are stable.  Patient reminded not to try to ambulate with numb legs, and that an RN must be present when she attempts to get up.       

## 2014-12-31 NOTE — Anesthesia Postprocedure Evaluation (Signed)
  Anesthesia Post-op Note  Patient: Erin JerseyMekia L Hefty  Procedure(s) Performed: * No procedures listed *  Patient Location: Mother/Baby  Anesthesia Type:Epidural  Level of Consciousness: awake, alert , oriented and patient cooperative  Airway and Oxygen Therapy: Patient Spontanous Breathing  Post-op Pain: none  Post-op Assessment: Post-op Vital signs reviewed, Patient's Cardiovascular Status Stable, Respiratory Function Stable, Patent Airway, No headache, No backache, No residual numbness and No residual motor weakness  Post-op Vital Signs: Reviewed and stable  Last Vitals:  Filed Vitals:   12/31/14 1426  BP: 124/56  Pulse: 86  Temp: 36.8 C  Resp: 18    Complications: No apparent anesthesia complications

## 2014-12-31 NOTE — Progress Notes (Signed)
Labor Progress Note  S: comfortable, resting  O:  BP 111/72 mmHg  Pulse 104  Temp(Src) 98.6 F (37 C) (Oral)  Resp 20  Ht 5\' 8"  (1.727 m)  Wt 284 lb (128.822 kg)  BMI 43.19 kg/m2  SpO2 100%  LMP 03/14/2014 Cat I CVE: not examined  A&P: 23 y.o. G1P0000 1089w6d SOL # labor: progressing normally, pushed for 30minutes with no change in station and no pressure with contractions, currently laboring down  Perry MountACOSTA,Veida Spira ROCIO, MD 8:55 AM

## 2014-12-31 NOTE — Plan of Care (Signed)
Problem: Consults Goal: Birthing Suites Patient Information Press F2 to bring up selections list Outcome: Completed/Met Date Met:  12/31/14  Pt > [redacted] weeks EGA

## 2014-12-31 NOTE — Progress Notes (Signed)
Erin JerseyMekia L Allison is a 24 y.o. G1P0 at 551w6d  Subjective: Comfortable w/ epidural, but feeling rectal pressure now and recently started pushing  Objective: BP 122/83 mmHg  Pulse 104  Temp(Src) 97.7 F (36.5 C) (Oral)  Resp 16  Ht 5\' 8"  (1.727 m)  Wt 128.822 kg (284 lb)  BMI 43.19 kg/m2  SpO2 100%  LMP 03/14/2014      FHT:  FHR: 125 bpm, variability: moderate,  accelerations:  Present,  decelerations:  Absent; occ mi variables UC:   regular, every 2-3 minutes SVE:   Dilation: 10 Effacement (%): 100 Station: +1 Exam by:: Enis SlipperJane Bailey, RN  Labs: Lab Results  Component Value Date   WBC 7.6 12/31/2014   HGB 11.0* 12/31/2014   HCT 33.9* 12/31/2014   MCV 66.0* 12/31/2014   PLT 279 12/31/2014    Assessment / Plan: IUP@term  End 1st stage/pushing  Continue pushing with contractions Anticipate SVD  SHAW, KIMBERLY CNM 12/31/2014, 7:57 AM

## 2014-12-31 NOTE — H&P (Signed)
Erin Allison is a 24 y.o. female G1 @ 40.6wks by 19wk U/S presenting for eval of labor. Denies leaking or bldg. No H/A, N/V/D. [facility] and has been remarkable for 1) asthma 2) GBS bacteruria 3) obesity  History OB History    Gravida Para Term Preterm AB TAB SAB Ectopic Multiple Living   1              Past Medical History  Diagnosis Date  . Arthritis    Past Surgical History  Procedure Laterality Date  . No past surgeries     Family History: family history is not on file. Social History:  reports that she has never smoked. She does not have any smokeless tobacco history on file. She reports that she does not drink alcohol or use illicit drugs.   Prenatal Transfer Tool  Maternal Diabetes: No Genetic Screening: Normal Maternal Ultrasounds/Referrals: Normal Fetal Ultrasounds or other Referrals:  None Maternal Substance Abuse:  No Significant Maternal Medications:  None Significant Maternal Lab Results:  Lab values include: Group B Strep positive Other Comments:  None  ROS  Dilation: 7 Effacement (%): 80 Station: -2 Exam by:: TEPPCO PartnersDenise Collison RN Blood pressure 126/80, pulse 90, temperature 98.4 F (36.9 C), temperature source Oral, resp. rate 18, height 5\' 8"  (1.727 m), weight 128.822 kg (284 lb), last menstrual period 03/14/2014. Exam Physical Exam  Constitutional: She is oriented to person, place, and time. She appears well-developed.  HENT:  Head: Normocephalic.  Neck: Normal range of motion.  Cardiovascular: Normal rate.   Respiratory: Effort normal.  GI:  EFM 130s, +accels, no decels Ctx q 2-3 mins  Musculoskeletal: Normal range of motion.  Neurological: She is alert and oriented to person, place, and time.  Skin: Skin is warm and dry.  Psychiatric: She has a normal mood and affect. Her behavior is normal. Thought content normal.    Prenatal labs: ABO, Rh: B/Positive/-- (11/23 0000) Antibody: Negative (11/23 0000) Rubella:  Immune (11/23 0000) RPR: Nonreactive (11/23 0000)  HBsAg: Negative (11/23 0000)  HIV: Non-reactive (11/23 0000)  GBS: Positive (11/23 0000)   Assessment/Plan: IUP@term  Active labor GBS positive  [facility] Amp for GBS prophylaxis Anticipate SVD   SHAW, KIMBERLY CNM 12/31/2014, 2:42 AM

## 2015-01-01 LAB — CBC
HEMATOCRIT: 27.9 % — AB (ref 36.0–46.0)
HEMOGLOBIN: 9.1 g/dL — AB (ref 12.0–15.0)
MCH: 21.5 pg — ABNORMAL LOW (ref 26.0–34.0)
MCHC: 32.6 g/dL (ref 30.0–36.0)
MCV: 65.8 fL — AB (ref 78.0–100.0)
Platelets: 219 10*3/uL (ref 150–400)
RBC: 4.24 MIL/uL (ref 3.87–5.11)
RDW: 14.9 % (ref 11.5–15.5)
WBC: 13.1 10*3/uL — ABNORMAL HIGH (ref 4.0–10.5)

## 2015-01-01 MED ORDER — IBUPROFEN 600 MG PO TABS
600.0000 mg | ORAL_TABLET | Freq: Four times a day (QID) | ORAL | Status: DC
Start: 2015-01-01 — End: 2016-03-04

## 2015-01-01 NOTE — Discharge Instructions (Signed)

## 2015-01-01 NOTE — Discharge Summary (Signed)
Obstetric Discharge Summary Reason for Admission: onset of labor Prenatal Procedures: ultrasound Intrapartum Procedures: spontaneous vaginal delivery Postpartum Procedures: none Complications-Operative and Postpartum: vaginal laceration  Patient is a 355w6d at 7255w6d who was admitted SOL  I was gloved and present for delivery in its entirety. Second stage of labor progressed, baby delivered. no decels during second stage noted. Complications: none Lacerations: bilateral labial, 1st degree EBL: 535cc, slow trickle of blood, fundus firm, lower uterine sweep revealed very minimal clots  Allison,Erin ROCIO, MD 1:18 PM      Expand All Collapse All   Patient is 24 y.o. G1P0000 9055w6d admitted SOL  Delivery Note At 11:42 AM a viable female was delivered via Vaginal, Spontaneous Delivery (Presentation: Left Occiput Posterior). APGAR: 9, 9; weight pending. Infant dried and placed on mother's abdomen. Cord clamped and cut by FOB. Hospital cord blood sample collected. Placenta delivered, fundal massage applied, and vagina inspected for lacerations. Of note upon delivery of placenta, trailing membranes were noted so a lower uterine sweep was done. Placenta status: Intact, Spontaneous. Cord: 3 vessels with the following complications: None. Erin LatheKristy Acosta, MD was gloved and actively participated in the entirety of the delivery.  Anesthesia: Epidural  Episiotomy: None Lacerations: L Labial, 1st degree perineal Suture Repair: 3.0 vicryl rapide Est. Blood Loss (mL): 535cc  Mom to postpartum. Baby to Couplet care / Skin to Skin.      Hospital Course:  Active Problems:   Indication for care or intervention in labor or delivery   Active labor at term   SVD (spontaneous vaginal delivery)   Erin Allison is a 24 y.o. G1P1001 s/p SVD.  Patient was admitted SOL.  She had a postpartum course that was uncomplicated including no problems with  ambulating, PO intake, urination, pain, or bleeding. The patient feels ready to go home and will be discharged with outpatient follow-up.   Today: No acute events overnight.  Pt denies problems with ambulating, voiding or po intake.  She denies nausea or vomiting.  Pain is well controlled.  She has had flatus. She has had bowel movement.  Lochia Small.  Plan for birth control is IUD.  Method of Feeding: Breast  Physical Exam:  Blood pressure 110/54, pulse 70, temperature 97.7 F (36.5 C), temperature source Oral, resp. rate 20, height 5\' 8"  (1.727 m), weight 284 lb (128.822 kg), last menstrual period 03/14/2014, SpO2 99 %, unknown if currently breastfeeding. General: alert, cooperative, appears stated age, no distress and moderately obese  Cardio: RRR, +2DP Pulm: no increased WOB Lochia: appropriate Uterine Fundus: firm DVT Evaluation: No evidence of DVT seen on physical exam. Negative Homan's sign. No cords or calf tenderness. No significant calf/ankle edema.  H/H: Lab Results  Component Value Date/Time   HGB 9.1* 01/01/2015 06:29 AM   HCT 27.9* 01/01/2015 06:29 AM    Discharge Diagnoses: Term Pregnancy-delivered  Discharge Information: Date: 01/01/2015 Activity: pelvic rest Diet: routine  Medications: PNV and Ibuprofen Breast feeding:  Yes Condition: stable Instructions: refer to handout Discharge to: home     Medication List    STOP taking these medications        acetaminophen 325 MG tablet  Commonly known as:  TYLENOL     acetaminophen 500 MG tablet  Commonly known as:  TYLENOL      TAKE these medications        albuterol 108 (90 BASE) MCG/ACT inhaler  Commonly known as:  PROVENTIL HFA;VENTOLIN HFA  Inhale 2 puffs into the lungs every 6 (  six) hours as needed for wheezing (wheezing). wheezing     ibuprofen 600 MG tablet  Commonly known as:  ADVIL,MOTRIN  Take 1 tablet (600 mg total) by mouth every 6 (six) hours.     multivitamin-prenatal 27-0.8 MG Tabs  tablet  Take 1 tablet by mouth daily at 12 noon.           Follow-up Information    [facility] [place]. Schedule an appointment as soon as possible for a visit in 6 weeks.   Why:  postpartum care   Contact information:   [address] [place] [id number] [phone]      Raliegh IpGottschalk, Ashly M, DO [facility], PGY-1 01/01/2015,8:01 AM

## 2015-01-01 NOTE — Progress Notes (Signed)
UR chart review completed.  

## 2015-01-04 ENCOUNTER — Inpatient Hospital Stay (HOSPITAL_COMMUNITY): Admission: RE | Admit: 2015-01-04 | Payer: Medicaid Other | Source: Ambulatory Visit

## 2016-03-04 ENCOUNTER — Emergency Department (HOSPITAL_COMMUNITY)
Admission: EM | Admit: 2016-03-04 | Discharge: 2016-03-04 | Disposition: A | Discharge status: Home / Self Care | Payer: Medicaid Other | Attending: Emergency Medicine | Admitting: Emergency Medicine

## 2016-03-04 ENCOUNTER — Encounter (HOSPITAL_COMMUNITY): Payer: Self-pay

## 2016-03-04 DIAGNOSIS — J45909 Unspecified asthma, uncomplicated: Secondary | ICD-10-CM | POA: Insufficient documentation

## 2016-03-04 DIAGNOSIS — K0889 Other specified disorders of teeth and supporting structures: Secondary | ICD-10-CM | POA: Insufficient documentation

## 2016-03-04 DIAGNOSIS — Z79899 Other long term (current) drug therapy: Secondary | ICD-10-CM | POA: Insufficient documentation

## 2016-03-04 MED ORDER — IBUPROFEN 200 MG PO TABS
400.0000 mg | ORAL_TABLET | Freq: Once | ORAL | Status: AC | PRN
  Administered 2016-03-04: 400 mg via ORAL
  Filled 2016-03-04: qty 2

## 2016-03-04 MED ORDER — IBUPROFEN 600 MG PO TABS: 600.0000 mg | ORAL_TABLET | Freq: Four times a day (QID) | ORAL | Status: DC

## 2016-03-04 MED ORDER — BUPIVACAINE-EPINEPHRINE (PF) 0.5% -1:200000 IJ SOLN
1.8000 mL | Freq: Once | INTRAMUSCULAR | Status: AC
  Administered 2016-03-04: 1.8 mL
  Filled 2016-03-04: qty 1.8

## 2016-03-04 MED ORDER — PENICILLIN V POTASSIUM 500 MG PO TABS: 500.0000 mg | ORAL_TABLET | Freq: Four times a day (QID) | ORAL | Status: AC

## 2016-03-04 NOTE — ED Notes (Signed)
bupivicaine at bedside.

## 2016-03-04 NOTE — ED Notes (Signed)
Pt c/o L upper and lower dental pain x 2-3 days r/t "broken wisdom teeth."  Pain score 9/10.  Pt report that teeth broke x 1 year ago.  Pt reports taking OTC pain medications w/o relief.

## 2016-03-04 NOTE — Discharge Instructions (Signed)
Please take your antibiotics as prescribed. Do not save or share them. Continue with Tylenol and Motrin for discomfort. Follow-up with your dentist or use the attached referral to help find a dentist for definitive care. Return to ED for new or worsening symptoms.   [facility] [employer]  Community Service Learning [facility]  [address]  [place], [place] [address]  Phone [phone]  [facility] [employer] [facility] in [place], [place], exemplifies the [employer] vision to improve the health and quality of life of all [place] by Public house managercreating leaders with a passion to care for the underserved and by leading the nation in community-based, service learning oral health education. We are committed to offering comprehensive general dental services for adults, children and special needs patients in a safe, caring and professional setting.  Appointments: Our clinic is open Monday through Friday 8:00 a.m. until 5:00 p.m. The amount of time scheduled for an appointment depends on the patients specific needs. We ask that you keep your appointed time for care or provide 24-hour notice of all appointment changes. Parents or legal guardians must accompany minor children.  Payment for Services: Medicaid and other insurance plans are welcome. Payment for services is due when services are rendered and may be made by cash or credit card. If you have dental insurance, we will assist you with your claim submission.   Emergencies: Emergency services will be provided Monday through Friday on a walk-in basis. Please arrive early for emergency services. After hours emergency services will be provided for patients of record as required.  Services:  Medical illustratorComprehensive General Dentistry  Childrens Dentistry  Oral Surgery - Extractions  Root Canals  Sealants and Tooth Colored Fillings  Crowns and  Bridges  Dentures and Partial Dentures  Implant Services  Periodontal Services and Cleanings  Cosmetic Building services engineerTooth Whitening  Digital Radiography  3-D[employer] [employer]    [employer] Dental [employer] 211 is a great source of information about community services available.  Access by dialing 2-1-1 from anywhere in [place], or by website -  PooledIncome.plwww.nc211.org.   Other Local Resources (Updated 08/2015)  Dental  Care   Services    Phone Number and Address  Cost  [facility] For children 60 - 24 years of age:   Cleaning  Tooth brushing/flossing instruction  Sealants, fillings, crowns  Extractions  Emergency treatment  [phone] [address] [place], [place] [address] Charges based on family income.  Medicaid and some insurance plans accepted.     [facility]  Cleaning  Sealants, fillings, crowns  Extractions  Emergency treatment [phone] [address] [place], [place]  Pregnant women 24 years of age or older with a Medicaid card  [facility] [place]  Cleaning  Sealants, fillings, crowns  Extractions  Emergency treatment [phone] [address] [place], [place] Pregnant women 24 years of age or older with a Medicaid card  [facility] For children 690 - 24 years of age:   Cleaning  Tooth brushing/flossing instruction  Sealants, fillings, crowns  Extractions  Emergency treatment Limited orthodontic services for patients with Medicaid [id number] [address] [place], [place] [address] Medicaid and [facility] Health Choice cover for children up to age 24 and pregnant women.  Parents of children up to age 24  without Medicaid pay a reduced fee at time of service.  [facility] [place] For children 340 - 24 years of age:    Cleaning  Tooth brushing/flossing instruction  Sealants, fillings, crowns  Extractions  Emergency treatment Limited orthodontic services for patients with Medicaid [phone] [address] [place], [place].  Medicaid and [place] Health Choice cover for children up to age 24 and pregnant women.  Parents of children up to age 24 without Medicaid pay a reduced fee.  [facility]  Cleaning  Sealants, fillings, crowns  Extractions  Hours: Tuesdays and Thursdays, 4:15 - 8 pm [phone] [address] [place], [place] [address] Services free of charge to [facility] residents ages 18-64 who do not have health insurance, Medicare, [place], or [place] benefits and fall within federal poverty guidelines  [facility]    Provides dental care in addition to primary medical care, nutritional counseling, and pharmacy:  Nurse, mental healthCleaning  Sealants, fillings, crowns  Extractions                  [phone] [facility], [address] [place], [place]  [phone] [facility], [address] [place], [place]  [phone] [facility] [place], [place]  [phone] [facility], [address] [place], [place]  [phone] [facility] [address] [place], [place] Accepts [place], [place]icare, most insurance.  Also provides services available to all with fees adjusted based on ability to pay.    [facility]  Cleaning  Tooth brushing/flossing instruction  Sealants, fillings, crowns  Extractions  Emergency treatment Hours: Tuesdays, Thursdays, and Fridays from 8 am to 5 pm by appointment only. [phone] [address] [place], [place] [address] [facility] residents with Medicaid (depending on eligibility) and children with [facility] Health Choice - call for more  information.  [facility]  Extractions only  Hours: 2nd and 4th Thursday of each month from 6:30 am - 9 am.   [phone] [address] [place], [place] [address] Ages 6718 and older only.  Patients are seen on a first come, first served basis.  [employer]  [employer]  Fillings  Extractions  Orthodontics  Endodontics  Implants/Crowns/Bridges  Complete and partial dentures [phone] [place], [place] Patients must complete an application for services.  There is often a waiting list.

## 2016-03-04 NOTE — ED Notes (Signed)
Patient was alert, oriented and stable upon discharge. RN went over AVS and patient had no further questions.  

## 2016-03-04 NOTE — ED Provider Notes (Signed)
CSN: [id number]     Arrival date & time 03/04/16  1812 History  By signing my name below, I, Majel HomerPeyton Lee, attest that this documentation has been prepared under the direction and in the presence of non-physician practitioner, Mayme GentaBen Yalena Colon, PA-C. Electronically Signed: Majel HomerPeyton Lee, Scribe. 03/04/2016. 10:16 PM.   Chief Complaint  Patient presents with  . Dental Pain   The history is provided by the patient. No language interpreter was used.   HPI Comments: Erin Allison is a 25 y.o. female with PMHx of asthma, who presents to the Emergency Department complaining of gradually worsening, 9/10, dental pain to her left, upper and lower wisdom teeth that began 2 days ago and worsened today. She notes her lower teeth are more painful than her upper teeth; though the pain is similar on both sides. Pt states her teeth are "broken." She states associated headache. She denies any allergies, sore throat, and drooling, Difficulty speaking. She states she does not currently have a dentist; she reports the last time she saw her dentist; she was told her dental insurance would end once she turned 25 y.o. no other modifying factors.  Past Medical History  Diagnosis Date  . Asthma    Past Surgical History  Procedure Laterality Date  . No past surgeries     No family history on file. Social History  Substance Use Topics  . Smoking status: Never Smoker   . Smokeless tobacco: Never Used  . Alcohol Use: Yes     Comment: occasional   OB History    Gravida Para Term Preterm AB TAB SAB Ectopic Multiple Living   1 1 1  0 0 0 0 0 0 1     Review of Systems 10 systems reviewed and all are negative for acute change except as noted in the HPI.  Allergies  Review of patient's allergies indicates no known allergies.  Home Medications   Prior to Admission medications   Medication Sig Start Date End Date Taking? Authorizing Provider  albuterol (PROVENTIL HFA;VENTOLIN HFA) 108 (90 BASE) MCG/ACT inhaler Inhale 2  puffs into the lungs every 6 (six) hours as needed for wheezing (wheezing). wheezing    Historical Provider, MD  ibuprofen (ADVIL,MOTRIN) 600 MG tablet Take 1 tablet (600 mg total) by mouth every 6 (six) hours. 03/04/16   Joycie PeekBenjamin Matteson Blue, PA-C  penicillin v potassium (VEETID) 500 MG tablet Take 1 tablet (500 mg total) by mouth 4 (four) times daily. 03/04/16 03/11/16  Joycie PeekBenjamin Nasya Vincent, PA-C  Prenatal Vit-Fe Fumarate-FA (MULTIVITAMIN-PRENATAL) 27-0.8 MG TABS tablet Take 1 tablet by mouth daily at 12 noon.    Historical Provider, MD   BP 139/92 mmHg  Pulse 73  Temp(Src) 98.5 F (36.9 C) (Oral)  Resp 16  SpO2 100% Physical Exam  Constitutional: She is oriented to person, place, and time. She appears well-developed and well-nourished. No distress.  HENT:  Head: Normocephalic and atraumatic.  Mouth/Throat: Oropharynx is clear and moist.  Discomfort located to posterior L molars upper/lower. Overall appropriate dentition. . Mucous membranes are moist. No unilateral tonsillar swelling, uvula midline, no glossal swelling or elevation. No trismus. No fluctuance or evidence of a drainable abscess. No other evidence of emergent infection, Retropharyngeal or Peritonsillar abscess, Ludwig or Vincents angina. Tolerating secretions well. Patent airway   Eyes: Conjunctivae and EOM are normal. Pupils are equal, round, and reactive to light. Right eye exhibits no discharge. Left eye exhibits no discharge. No scleral icterus.  Neck: Normal range of motion. Neck supple. No JVD  present. No tracheal deviation present.  Cardiovascular: Normal rate, regular rhythm and normal heart sounds.   Pulmonary/Chest: Effort normal and breath sounds normal. No stridor. No respiratory distress. She has no wheezes. She has no rales.  Abdominal: Soft. She exhibits no distension. There is no tenderness.  Musculoskeletal: Normal range of motion. She exhibits no tenderness.  Neurological: She is alert and oriented to person, place, and  time. Coordination normal.  Cranial Nerves II-XII grossly intact  Skin: Skin is warm and dry. No rash noted. She is not diaphoretic.  Psychiatric: She has a normal mood and affect. Her behavior is normal. Judgment and thought content normal.  Nursing note and vitals reviewed.  ED Course  Procedures  DIAGNOSTIC STUDIES:  Oxygen Saturation is 100% on RA, normal by my interpretation.    COORDINATION OF CARE:  9:22 PM Discussed treatment plan with pt at bedside and pt agreed to plan.  Labs Review Labs Reviewed - No data to display  Imaging Review No results found. I have personally reviewed and evaluated these images and lab results as part of my medical decision-making.   EKG Interpretation None     NERVE BLOCK Performed by: Sharlene Mottsartner, Sindhu Nguyen W Consent: Verbal consent obtained. Required items: required blood products, implants, devices, and special equipment available Time out: Immediately prior to procedure a "time out" was called to verify the correct patient, procedure, equipment, support staff and site/side marked as required.  Indication: Dental pain  Nerve block body site: Left inferior alveolar   Preparation: Patient was prepped and draped in the usual sterile fashion. Needle gauge: 24 G Location technique: anatomical landmarks  Local anesthetic: Bupivacaine   Anesthetic total: 1.8 ml  Outcome: pain improved Patient tolerance: Patient tolerated the procedure well with no immediate complications.  MDM  Here for evaluation of dental pain. On exam, there is no evidence of a drainable abscess. No trismus, glossal elevation, unilateral tonsillar swelling. No evidence of retropharyngeal or peritonsillar abscess or Ludwig angina. Patient received a dental block in the ED and experiences relief. Discharged with outpatient dental resources as well as referral to on-call dentistry. Also given prescription for anti-inflammatories and antibiotics. Overall, appears well,  nontoxic and appropriate for discharge.  Final diagnoses:  Dentalgia   I personally performed the services described in this documentation, which was scribed in my presence. The recorded information has been reviewed and is accurate.    Joycie PeekBenjamin Saoirse Legere, PA-C 03/04/16 2217  Arby BarretteMarcy Pfeiffer, MD 03/05/16 [phone]

## 2016-06-11 ENCOUNTER — Emergency Department (HOSPITAL_COMMUNITY)
Admission: EM | Admit: 2016-06-11 | Discharge: 2016-06-11 | Disposition: A | Discharge status: Home / Self Care | Payer: Medicaid Other | Attending: Emergency Medicine | Admitting: Emergency Medicine

## 2016-06-11 DIAGNOSIS — J45909 Unspecified asthma, uncomplicated: Secondary | ICD-10-CM | POA: Insufficient documentation

## 2016-06-11 DIAGNOSIS — L309 Dermatitis, unspecified: Secondary | ICD-10-CM | POA: Insufficient documentation

## 2016-06-11 MED ORDER — PREDNISONE 50 MG PO TABS: 50.0000 mg | ORAL_TABLET | Freq: Every day | ORAL | 0 refills | Status: DC

## 2016-06-11 MED ORDER — DIPHENHYDRAMINE HCL 25 MG PO TABS: 25.0000 mg | ORAL_TABLET | Freq: Four times a day (QID) | ORAL | 0 refills | Status: DC

## 2016-06-11 NOTE — ED Triage Notes (Addendum)
Pt reports bumps on her scalp that she noticed this morning. She also reports feeling like her throat is closing. No resp distress noted, lung sounds clear. Resp e/u. Pt denies changing meds, shampoo, soaps, or detergents.

## 2016-06-11 NOTE — Discharge Instructions (Signed)
Please take 1 tablet of prednisone each day for 5 days, and benadryl as needed for relief of the itching. Benadryl can make you sleepy, so do not drive when taking.

## 2016-06-11 NOTE — ED Provider Notes (Signed)
Patient not in room   Erin Grizzleanielle Tawonna Esquer, MD 06/11/16 [phone]

## 2016-06-11 NOTE — ED Provider Notes (Signed)
[facility]PT Provider Note   CSN: [id number] Arrival date & time: 06/11/16  9563     History   Chief Complaint Chief Complaint  Patient presents with  . Rash  . Allergic Reaction    HPI Erin Allison is a 25 y.o. female.  Patient is 25 yo F with no PMH, presenting with itchy "bumps in hair" that she noticed this morning. States the itching started last night, but she did not notice any bumps. She woke up this morning, with bumps along her anterior and posterior hairline. The bumps are not painful with no drainage or fluctuance noted. She denies seeing any insects when brushing her hair, other family members with similar complaints, or changes to shampoo, lotion, detergent, or wearing new hats. No recent fevers, chills, or other changes to hair, skin, or nails. Also reports "scratchy throat" this morning, but no pain, shortness of breath, wheezes, cough, or difficulty swallowing. Patient does not smoke.      Past Medical History:  Diagnosis Date  . Asthma     Patient Active Problem List   Diagnosis Date Noted  . Indication for care or intervention in labor or delivery 12/31/2014  . Active labor at term 12/31/2014  . SVD (spontaneous vaginal delivery) 12/31/2014  . Evaluate anatomy not seen on prior sonogram   . [redacted] weeks gestation of pregnancy   . Screening, antenatal, for fetal anatomic survey     Past Surgical History:  Procedure Laterality Date  . NO PAST SURGERIES      OB History    Gravida Para Term Preterm AB Living   1 1 1  0 0 1   SAB TAB Ectopic Multiple Live Births   0 0 0 0 1       Home Medications    Prior to Admission medications   Medication Sig Start Date End Date Taking? Authorizing Provider  ibuprofen (ADVIL,MOTRIN) 600 MG tablet Take 1 tablet (600 mg total) by mouth every 6 (six) hours. 03/04/16  Yes Benjamin Cartner, PA-C  albuterol (PROVENTIL HFA;VENTOLIN HFA) 108 (90 BASE) MCG/ACT inhaler Inhale 2 puffs into the lungs every 6  (six) hours as needed for wheezing (wheezing). wheezing    Historical Provider, MD  Prenatal Vit-Fe Fumarate-FA (MULTIVITAMIN-PRENATAL) 27-0.8 MG TABS tablet Take 1 tablet by mouth daily at 12 noon.    Historical Provider, MD    Family History No family history on file.  Social History Social History  Substance Use Topics  . Smoking status: Never Smoker  . Smokeless tobacco: Never Used  . Alcohol use Yes     Comment: occasional     Allergies   Review of patient's allergies indicates no known allergies.   Review of Systems Review of Systems  Constitutional: Negative for chills and fever.  HENT: Negative for ear pain, mouth sores, sore throat and trouble swallowing.   Eyes: Negative for pain and visual disturbance.  Respiratory: Negative for cough, choking, chest tightness, shortness of breath and wheezing.   Cardiovascular: Negative for chest pain, palpitations and leg swelling.  Gastrointestinal: Negative for abdominal pain, nausea and vomiting.  Genitourinary: Negative for dysuria.  Musculoskeletal: Negative for back pain and neck pain.  Skin: Positive for rash. Negative for color change.  Neurological: Negative for dizziness, seizures, syncope, weakness, numbness and headaches.     Physical Exam Updated Vital Signs BP 128/87 (BP Location: Left Arm)   Pulse 88   Temp 97.7 F (36.5 C) (Oral)   Resp 18   Wt Marland Kitchen)  139.1 kg   SpO2 100%   BMI 46.62 kg/m   Physical Exam  Constitutional: She appears well-developed and well-nourished. No distress.  HENT:  Head: Normocephalic and atraumatic.  Right Ear: External ear normal.  Left Ear: External ear normal.  Mouth/Throat: Oropharynx is clear and moist.  TMs and ear canals normal bilaterally. No nasal mucosal edema or erythema. No frontal or maxillary sinus tenderness. Uvula midline, no trismus. Dentition normal, no dental abscess or oral lesions. No oropharyngeal exudate, erythema, or edema. No tonsillar abscess or  exudate.  Eyes: Conjunctivae are normal.  Neck: Normal range of motion. Neck supple. No thyromegaly present.  Cardiovascular: Normal rate, regular rhythm, normal heart sounds and intact distal pulses.   Pulmonary/Chest: Effort normal and breath sounds normal. No respiratory distress. She has no wheezes. She exhibits no tenderness.  Abdominal: Soft. There is no tenderness.  Musculoskeletal: Normal range of motion. She exhibits no edema or tenderness.  Lymphadenopathy:    She has no cervical adenopathy.  Neurological: She is alert.  Skin: Skin is warm and dry.  Diffuse flesh colored bumps noted to anterior and posterior hairline. No tenderness, erythema, swelling, fluctuance, or drainage. No nits or lice noted.  Psychiatric: She has a normal mood and affect.  Nursing note and vitals reviewed.    ED Treatments / Results  Labs (all labs ordered are listed, but only abnormal results are displayed) Labs Reviewed - No data to display  EKG  EKG Interpretation None       Radiology No results found.  Procedures Procedures (including critical care time)  Medications Ordered in ED Medications - No data to display   Initial Impression / Assessment and Plan / ED Course  I have reviewed the triage vital signs and the nursing notes.  Pertinent labs & imaging results that were available during my care of the patient were reviewed by me and considered in my medical decision making (see chart for details).  Clinical Course   Patient is 25 yo F with no PMH, presenting with itchy "bumps in hair." On physical exam, diffuse flesh colored bumps noted to anterior and posterior hairline, but no tenderness, erythema, or fluctuance noted. Does not appear to be infected, abscess, seborrhea, folliculitis, or lice. No evidence of wheals or urticaria concerning for anaphylaxis, and breath sounds clear bilaterally. Given prescription for 5 days of PO prednisone, and Benadryl for relief of itching.  Stable for d/c home. Given information to establish care with PCP, and return precautions to ED for symptoms including new skin changes, difficulty swallowing, wheezing, shortness of breath, or respiratory distress.   Final Clinical Impressions(s) / ED Diagnoses   Final diagnoses:  Dermatitis    New Prescriptions Discharge Medication List as of 06/11/2016 10:39 AM    START taking these medications   Details  diphenhydrAMINE (BENADRYL) 25 MG tablet Take 1 tablet (25 mg total) by mouth every 6 (six) hours., Starting Fri 06/11/2016, Print    predniSONE (DELTASONE) 50 MG tablet Take 1 tablet (50 mg total) by mouth daily., Starting Fri 06/11/2016, Print         Kensi Karr F de BakersfieldVillier II, GeorgiaPA 06/11/16 [id number]    Margarita Grizzleanielle Ray, MD 06/13/16 [phone]

## 2016-06-16 ENCOUNTER — Encounter (HOSPITAL_COMMUNITY): Payer: Self-pay | Admitting: Emergency Medicine

## 2016-06-16 ENCOUNTER — Emergency Department (HOSPITAL_COMMUNITY)
Admission: EM | Admit: 2016-06-16 | Discharge: 2016-06-16 | Disposition: A | Discharge status: Home / Self Care | Payer: Medicaid Other | Attending: Emergency Medicine | Admitting: Emergency Medicine

## 2016-06-16 DIAGNOSIS — R220 Localized swelling, mass and lump, head: Secondary | ICD-10-CM | POA: Insufficient documentation

## 2016-06-16 DIAGNOSIS — J45909 Unspecified asthma, uncomplicated: Secondary | ICD-10-CM | POA: Insufficient documentation

## 2016-06-16 MED ORDER — PREDNISONE 50 MG PO TABS: ORAL_TABLET | ORAL | 0 refills | Status: DC

## 2016-06-16 MED ORDER — DIPHENHYDRAMINE HCL 25 MG PO CAPS
25.0000 mg | ORAL_CAPSULE | Freq: Once | ORAL | Status: AC
  Administered 2016-06-16: 25 mg via ORAL
  Filled 2016-06-16: qty 1

## 2016-06-16 MED ORDER — FAMOTIDINE 20 MG PO TABS: 20.0000 mg | ORAL_TABLET | Freq: Two times a day (BID) | ORAL | 0 refills | Status: AC

## 2016-06-16 MED ORDER — EPINEPHRINE 0.3 MG/0.3ML IJ SOAJ: 0.3000 mg | Freq: Once | INTRAMUSCULAR | 0 refills | Status: AC

## 2016-06-16 MED ORDER — FAMOTIDINE 20 MG PO TABS
20.0000 mg | ORAL_TABLET | Freq: Once | ORAL | Status: AC
  Administered 2016-06-16: 20 mg via ORAL
  Filled 2016-06-16: qty 1

## 2016-06-16 MED ORDER — DIPHENHYDRAMINE HCL 25 MG PO TABS: 25.0000 mg | ORAL_TABLET | Freq: Four times a day (QID) | ORAL | 0 refills | Status: AC | PRN

## 2016-06-16 MED ORDER — PREDNISONE 20 MG PO TABS
60.0000 mg | ORAL_TABLET | Freq: Once | ORAL | Status: AC
  Administered 2016-06-16: 60 mg via ORAL
  Filled 2016-06-16: qty 3

## 2016-06-16 NOTE — ED Notes (Signed)
Pt is in stable condition upon d/c and ambulates from ED. 

## 2016-06-16 NOTE — ED Triage Notes (Signed)
Pt presents to ED for assessment of lower lip swelling beginning this morning.  Pt sts she was seen here this weekend for rash to her head and was given Benadryl and Prednisone.  Pt sts she took her last dose of Prednison yesterday, and woke up this morning with lip swelling.  Denies difficulty breathing/swallowing, denies SOB or CP./

## 2016-06-16 NOTE — ED Provider Notes (Signed)
[facility] Provider Note  By signing my name below, I, Erin Allison, attest that this documentation has been prepared under the direction and in the presence of [facility], [place]. Electronically Signed: Earmon Allison, ED Scribe. 06/16/16. 6:38 PM.    History   Chief Complaint Chief Complaint  Patient presents with  . Oral Swelling    The history is provided by the patient and medical records. No language interpreter was used.    HPI Comments:  Erin Allison is a 25 y.o. female who presents to the Emergency Department complaining of swelling to her bottom lip that began upon waking this morning. Pt was seen here five days ago with an allergic reaction of an unknown source and was prescribed Prednisone, taking her last dose last night. She has applied ice to the area and took Benadryl several hours ago with no significant relief of the swelling. She denies any new foods, insect bites or personal care items. She denies modifying factors. She denies rash, SOB, difficulty swallowing, fever, chills, nausea, vomiting, abdominal pain, urinary symptoms, neck pain, numbness, tingling or weakness of the face or any extremity.  Past Medical History:  Diagnosis Date  . Asthma     Patient Active Problem List   Diagnosis Date Noted  . Indication for care or intervention in labor or delivery 12/31/2014  . Active labor at term 12/31/2014  . SVD (spontaneous vaginal delivery) 12/31/2014  . Evaluate anatomy not seen on prior sonogram   . [redacted] weeks gestation of pregnancy   . Screening, antenatal, for fetal anatomic survey     Past Surgical History:  Procedure Laterality Date  . NO PAST SURGERIES      OB History    Gravida Para Term Preterm AB Living   1 1 1  0 0 1   SAB TAB Ectopic Multiple Live Births   0 0 0 0 1       Home Medications    Prior to Admission medications   Medication Sig Start Date End Date Taking? Authorizing Provider  albuterol (PROVENTIL  HFA;VENTOLIN HFA) 108 (90 BASE) MCG/ACT inhaler Inhale 2 puffs into the lungs every 6 (six) hours as needed for wheezing (wheezing). wheezing   Yes Historical Provider, MD  diphenhydrAMINE (BENADRYL) 25 MG tablet Take 1 tablet (25 mg total) by mouth every 6 (six) hours as needed. 06/16/16   Shannie Kontos Orlene Och, NP  EPINEPHrine 0.3 mg/0.3 mL IJ SOAJ injection Inject 0.3 mLs (0.3 mg total) into the muscle once. 06/16/16 06/16/16  Jaryah Aracena Orlene Och, NP  famotidine (PEPCID) 20 MG tablet Take 1 tablet (20 mg total) by mouth 2 (two) times daily. 06/16/16   Blanche Scovell Orlene Och, NP  predniSONE (DELTASONE) 50 MG tablet Take one tablet daily 06/16/16   Vincenzina Jagoda Orlene Och, NP    Family History History reviewed. No pertinent family history.  Social History Social History  Substance Use Topics  . Smoking status: Never Smoker  . Smokeless tobacco: Never Used  . Alcohol use Yes     Comment: occasional     Allergies   Review of patient's allergies indicates no known allergies.   Review of Systems Review of Systems  Constitutional: Negative for chills and fever.  HENT: Negative for sore throat and trouble swallowing.        Lower lip swelling  Respiratory: Negative for shortness of breath.   Cardiovascular: Negative for chest pain.  Gastrointestinal: Negative for abdominal pain, nausea and vomiting.  Genitourinary: Negative.   Musculoskeletal: Negative  for neck pain.  Skin: Negative for color change and wound.  Neurological: Negative for weakness and numbness.  Psychiatric/Behavioral: Negative for confusion.     Physical Exam Updated Vital Signs BP 144/89 (BP Location: Right Arm)   Pulse 80   Temp 98.4 F (36.9 C) (Oral)   Resp 18   SpO2 99%   Physical Exam  Constitutional: She is oriented to person, place, and time. She appears well-developed and well-nourished.  HENT:  Mouth/Throat: Uvula is midline, oropharynx is clear and moist and mucous membranes are normal. No oropharyngeal exudate, posterior  oropharyngeal edema or posterior oropharyngeal erythema.  Swelling to bottom lip, worse on left than right. No difficulty swallowing.  Eyes: EOM are normal.  Neck: Neck supple.  Cardiovascular: Normal rate and regular rhythm.  Exam reveals no gallop and no friction rub.   No murmur heard. Pulmonary/Chest: Effort normal and breath sounds normal. No respiratory distress. She has no wheezes. She has no rales.  Abdominal: Soft. There is no tenderness.  Musculoskeletal: Normal range of motion.  Lymphadenopathy:    She has no cervical adenopathy.  Neurological: She is alert and oriented to person, place, and time. No cranial nerve deficit.  Skin: Skin is warm and dry.  Nursing note and vitals reviewed.    ED Treatments / Results  DIAGNOSTIC STUDIES: Oxygen Saturation is 99% on RA, normal by my interpretation.   COORDINATION OF CARE: 5:41 PM- Will review chart to determine appropriate course of treatment. Pt verbalizes understanding and agrees to plan.  6:35 PM- Spoke with Dr. Roselyn Bering about appropriate course of treatment and per his advice, will refer to [facility]. Will give Prednisone, Benadryl and Pepcid prior to discharge and give prescription for Epi-Pen at discharge.   Medications  predniSONE (DELTASONE) tablet 60 mg (60 mg Oral Given 06/16/16 1821)  diphenhydrAMINE (BENADRYL) capsule 25 mg (25 mg Oral Given 06/16/16 1821)  famotidine (PEPCID) tablet 20 mg (20 mg Oral Given 06/16/16 1821)    Radiology No results found.  Procedures Procedures (including critical care time)  Medications Ordered in ED Medications  predniSONE (DELTASONE) tablet 60 mg (60 mg Oral Given 06/16/16 1821)  diphenhydrAMINE (BENADRYL) capsule 25 mg (25 mg Oral Given 06/16/16 1821)  famotidine (PEPCID) tablet 20 mg (20 mg Oral Given 06/16/16 1821)     Initial Impression / Assessment and Plan / ED Course  I have reviewed the triage vital signs and the nursing notes.  Clinical Course     Patient re-evaluated prior to dc, is hemodynamically stable, in no respiratory distress, and denies the feeling of throat closing, normal phonation. No wheezing, no vomiting, no syncope. Discussed signs and symptoms of anaphylaxis and severe allergic reaction. Pt advised to return for any worsening in symptoms or any concerns. Pt treated with Pepcid, Benadryl and Prednisone. Spoke with Dr. Iantha Fallen and per his advise will give referral to [place] [facility] and prescribe EpiPen. Pt is to follow up with their PCP. Pt is agreeable with plan & verbalizes understanding.    Final Clinical Impressions(s) / ED Diagnoses   Final diagnoses:  Lip swelling    New Prescriptions New Prescriptions   DIPHENHYDRAMINE (BENADRYL) 25 MG TABLET    Take 1 tablet (25 mg total) by mouth every 6 (six) hours as needed.   EPINEPHRINE 0.3 MG/0.3 ML IJ SOAJ INJECTION    Inject 0.3 mLs (0.3 mg total) into the muscle once.   FAMOTIDINE (PEPCID) 20 MG TABLET    Take 1 tablet (20 mg  total) by mouth 2 (two) times daily.   PREDNISONE (DELTASONE) 50 MG TABLET    Take one tablet daily    I personally performed the services described in this documentation, which was scribed in my presence. The recorded information has been reviewed and is accurate.     [address] Valladares, NP 06/18/16 [id number]    Linwood DibblesJon Knapp, MD 06/19/16 [phone]

## 2016-06-16 NOTE — ED Notes (Signed)
Called for triage X1 

## 2016-08-15 ENCOUNTER — Emergency Department (HOSPITAL_COMMUNITY)
Admission: EM | Admit: 2016-08-15 | Discharge: 2016-08-15 | Disposition: A | Discharge status: Home / Self Care | Payer: Medicaid Other | Attending: Emergency Medicine | Admitting: Emergency Medicine

## 2016-08-15 ENCOUNTER — Emergency Department (HOSPITAL_COMMUNITY): Payer: Medicaid Other

## 2016-08-15 ENCOUNTER — Encounter (HOSPITAL_COMMUNITY): Payer: Self-pay

## 2016-08-15 DIAGNOSIS — S61216A Laceration without foreign body of right little finger without damage to nail, initial encounter: Secondary | ICD-10-CM | POA: Insufficient documentation

## 2016-08-15 DIAGNOSIS — J45909 Unspecified asthma, uncomplicated: Secondary | ICD-10-CM | POA: Insufficient documentation

## 2016-08-15 DIAGNOSIS — Y999 Unspecified external cause status: Secondary | ICD-10-CM | POA: Insufficient documentation

## 2016-08-15 DIAGNOSIS — Z23 Encounter for immunization: Secondary | ICD-10-CM | POA: Insufficient documentation

## 2016-08-15 DIAGNOSIS — W228XXA Striking against or struck by other objects, initial encounter: Secondary | ICD-10-CM | POA: Insufficient documentation

## 2016-08-15 DIAGNOSIS — Y929 Unspecified place or not applicable: Secondary | ICD-10-CM | POA: Insufficient documentation

## 2016-08-15 DIAGNOSIS — Y939 Activity, unspecified: Secondary | ICD-10-CM | POA: Insufficient documentation

## 2016-08-15 DIAGNOSIS — S60221A Contusion of right hand, initial encounter: Secondary | ICD-10-CM

## 2016-08-15 IMAGING — CR DG HAND COMPLETE 3+V*R*
4 series · 4 of 4 positions shown · non-contrast
Comparison: None.

CLINICAL DATA: Right hand injury after punching a door. Laceration
to lateral surface of fifth finger.

EXAM:
RIGHT HAND - COMPLETE 3+ VIEW

[hand pa]
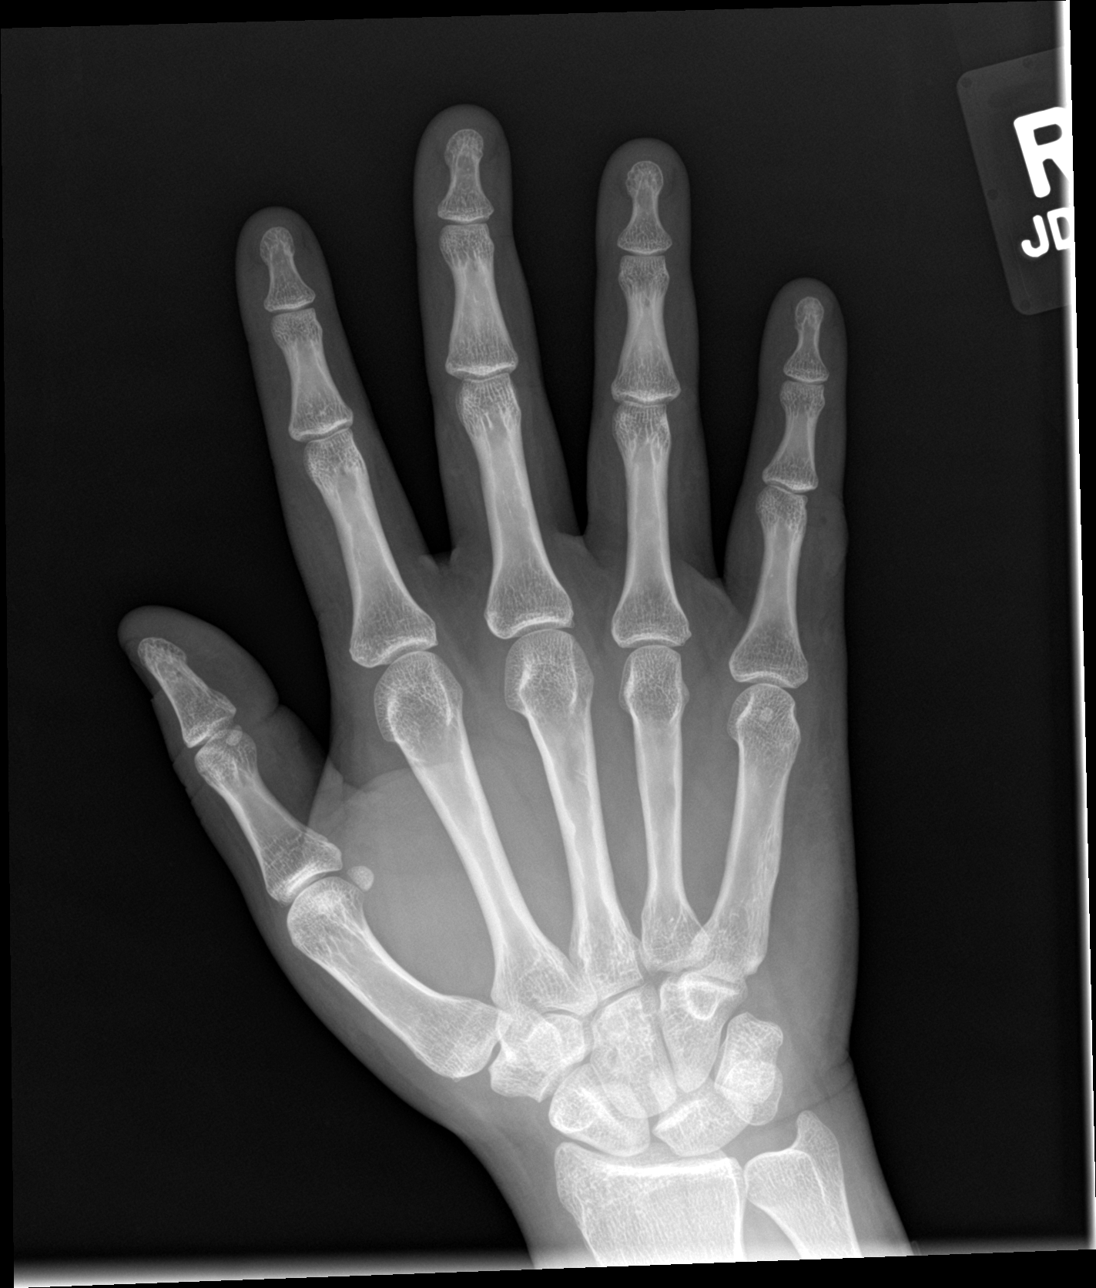

[hand obl]
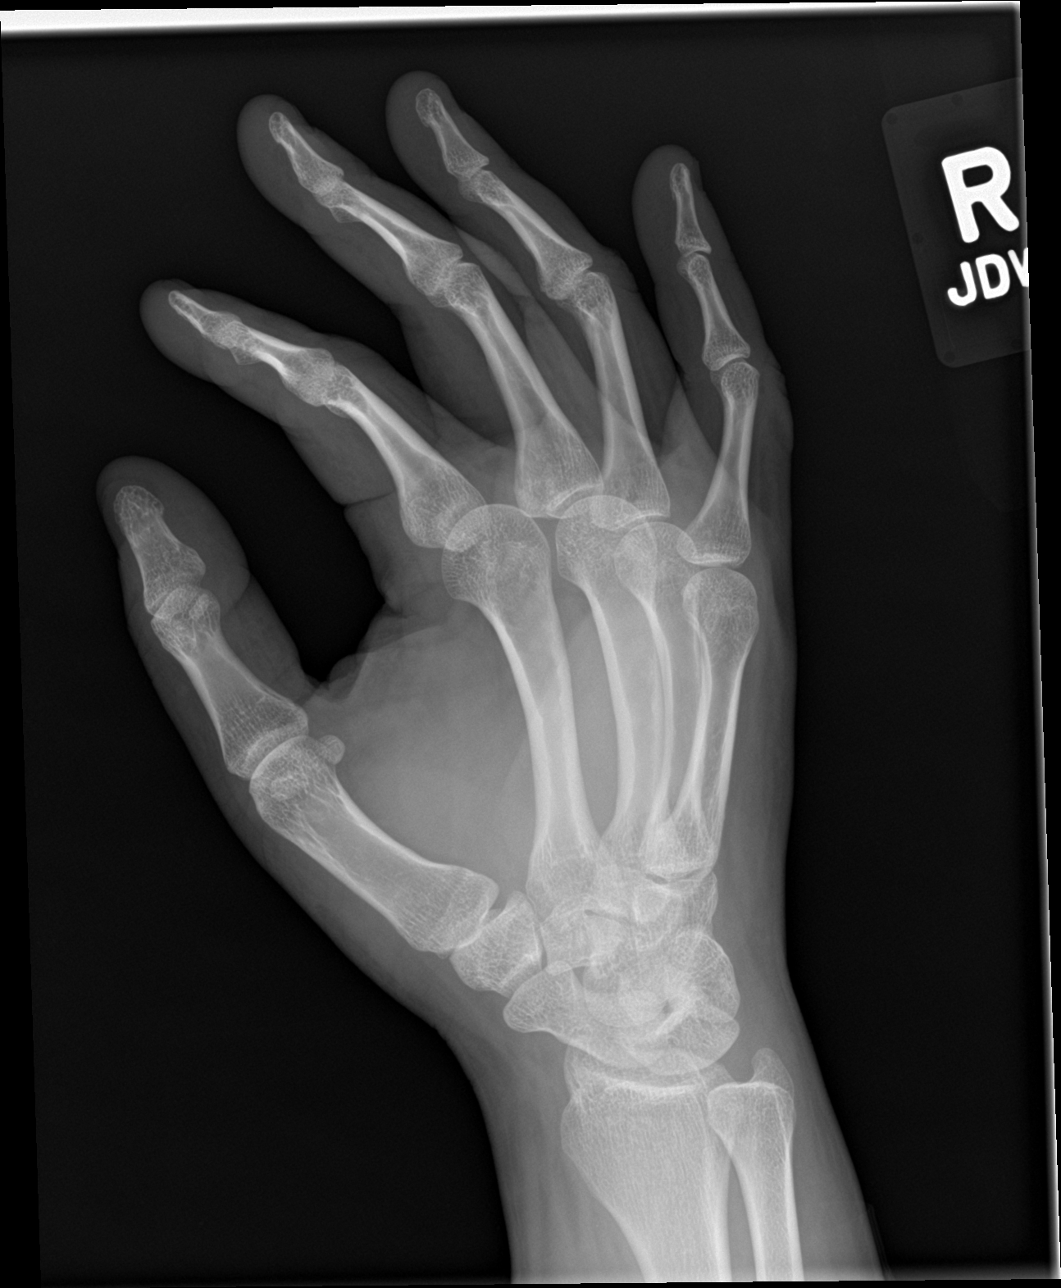

[hand lat (1 of 2)]
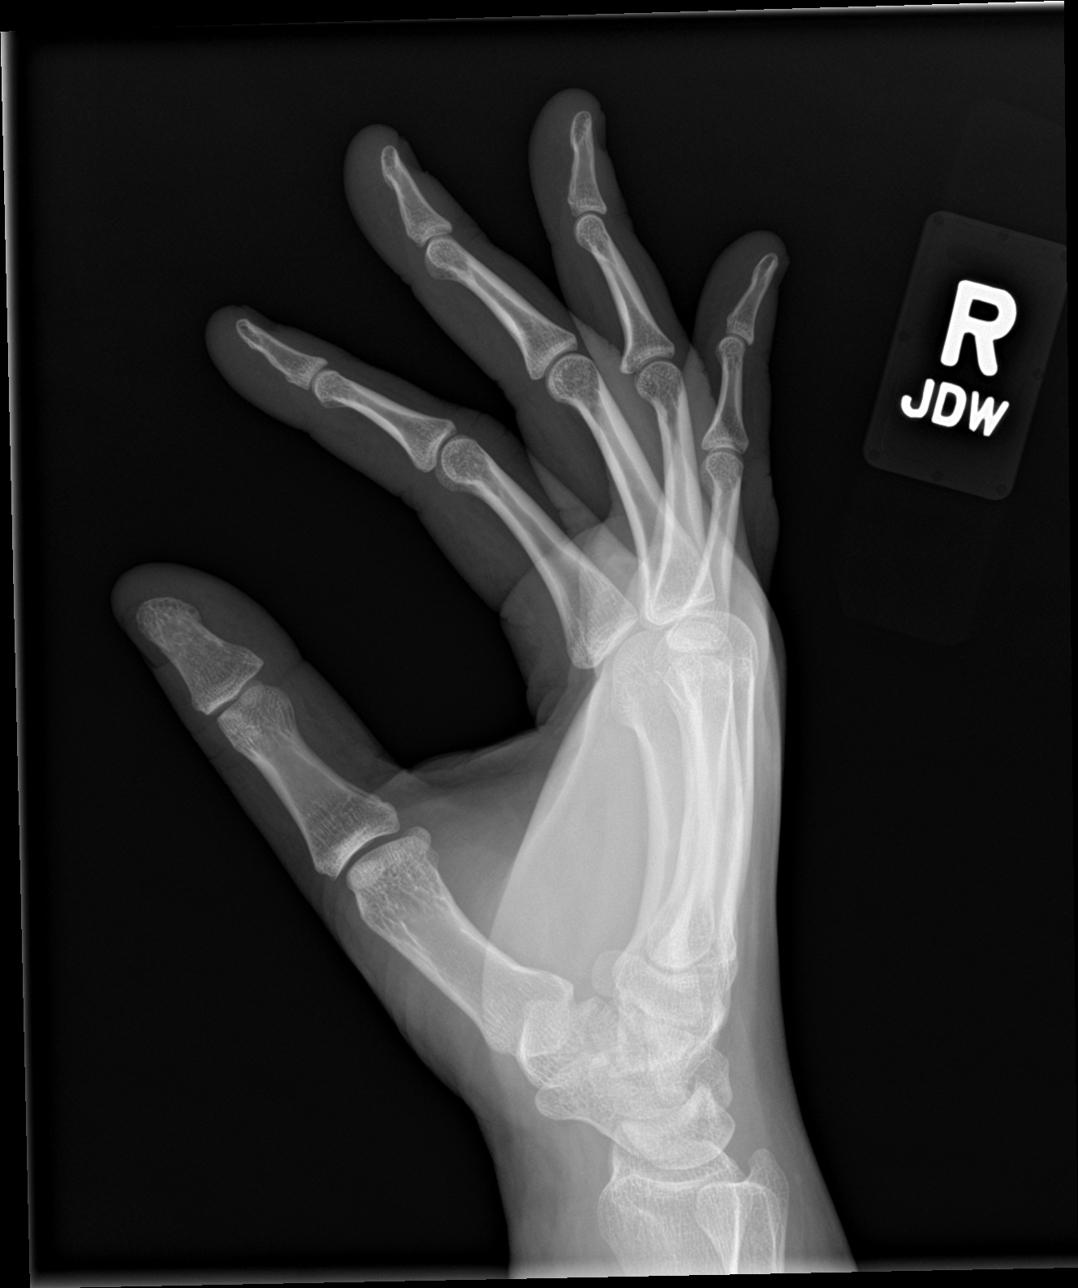

[hand lat (2 of 2)]
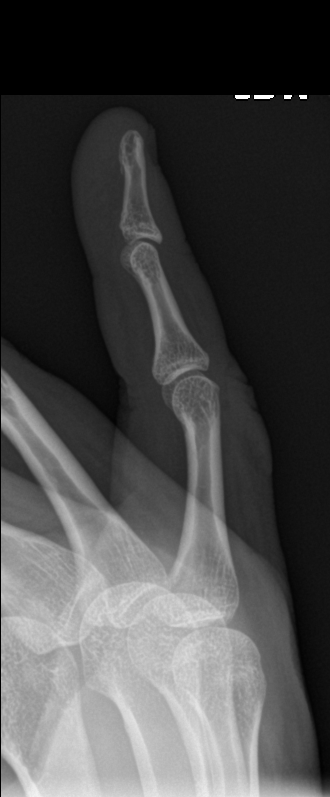

[4 of 4 positions shown; findings below may reference images not displayed]

FINDINGS: There is no evidence of fracture or dislocation. There is no
evidence of arthropathy or other focal bone abnormality. No
radiopaque foreign body.
IMPRESSION: No acute osseous injury of the right hand. No radiopaque foreign
body.

## 2016-08-15 MED ORDER — TETANUS-DIPHTH-ACELL PERTUSSIS 5-2.5-18.5 LF-MCG/0.5 IM SUSP
0.5000 mL | Freq: Once | INTRAMUSCULAR | Status: AC
  Administered 2016-08-15: 0.5 mL via INTRAMUSCULAR
  Filled 2016-08-15: qty 0.5

## 2016-08-15 MED ORDER — LIDOCAINE HCL (PF) 1 % IJ SOLN
10.0000 mL | Freq: Once | INTRAMUSCULAR | Status: AC
  Administered 2016-08-15: 10 mL via INTRADERMAL
  Filled 2016-08-15: qty 10

## 2016-08-15 NOTE — ED Triage Notes (Signed)
Pt states punched wooden door. Pt with small lac to R 5th and 4th fingers. Small abrasions to 2nd and 3rd fingers as well. Pt with full ROM, good sensation/cap refill at triage.

## 2016-08-16 NOTE — ED Provider Notes (Signed)
[facility] Provider Note   CSN: [id number] Arrival date & time: 08/15/16  1956     History   Chief Complaint Chief Complaint  Patient presents with  . Hand Injury    HPI Erin BignessMekia L Leatha GildingLivingston is a 25 y.o. female.  HPI   Patient punched a wall just prior to arrival. Immediate pain to the right hand with laceration.  Pain moderate. Mod-severe bleeding, stopped with pressure while in ED. Pt not sure if UTD on tetanus. No difficulty moving hand, no numbness.   Past Medical History:  Diagnosis Date  . Asthma     Patient Active Problem List   Diagnosis Date Noted  . Indication for care or intervention in labor or delivery 12/31/2014  . Active labor at term 12/31/2014  . SVD (spontaneous vaginal delivery) 12/31/2014  . Evaluate anatomy not seen on prior sonogram   . [redacted] weeks gestation of pregnancy   . Screening, antenatal, for fetal anatomic survey     Past Surgical History:  Procedure Laterality Date  . NO PAST SURGERIES      OB History    Gravida Para Term Preterm AB Living   1 1 1  0 0 1   SAB TAB Ectopic Multiple Live Births   0 0 0 0 1       Home Medications    Prior to Admission medications   Medication Sig Start Date End Date Taking? Authorizing Provider  albuterol (PROVENTIL HFA;VENTOLIN HFA) 108 (90 BASE) MCG/ACT inhaler Inhale 2 puffs into the lungs every 6 (six) hours as needed for wheezing (wheezing). wheezing    Historical Provider, MD  diphenhydrAMINE (BENADRYL) 25 MG tablet Take 1 tablet (25 mg total) by mouth every 6 (six) hours as needed. 06/16/16   Hope Orlene OchM Neese, NP  famotidine (PEPCID) 20 MG tablet Take 1 tablet (20 mg total) by mouth 2 (two) times daily. 06/16/16   Hope Orlene OchM Neese, NP  predniSONE (DELTASONE) 50 MG tablet Take one tablet daily 06/16/16   Hope Orlene OchM Neese, NP    Family History History reviewed. No pertinent family history.  Social History Social History  Substance Use Topics  . Smoking status: Never Smoker  . Smokeless  tobacco: Never Used  . Alcohol use Yes     Comment: occasional     Allergies   Patient has no known allergies.   Review of Systems Review of Systems  Musculoskeletal: Positive for arthralgias.  Skin: Positive for wound.  Neurological: Negative for syncope, weakness and numbness.     Physical Exam Updated Vital Signs BP 138/90 (BP Location: Left Arm)   Pulse 92   Temp 98.8 F (37.1 C) (Oral)   Resp 18   SpO2 97%   Physical Exam  Constitutional: She is oriented to person, place, and time. She appears well-developed and well-nourished. No distress.  HENT:  Head: Normocephalic and atraumatic.  Eyes: Conjunctivae and EOM are normal.  Neck: Normal range of motion.  Cardiovascular: Normal rate, regular rhythm and intact distal pulses.   Pulmonary/Chest: Effort normal. No respiratory distress.  Musculoskeletal: She exhibits no edema or tenderness.  No bony tenderness, full extension and flexion of fingers, normal sensation and cap refill  Neurological: She is alert and oriented to person, place, and time.  Skin: Skin is warm and dry. No rash noted. She is not diaphoretic. No erythema.  2cm laceration right pinky finger, abrasion to right ring finger, tiny abrasion on 2nd and 3rd finger  Nursing note and vitals reviewed.  ED Treatments / Results  Labs (all labs ordered are listed, but only abnormal results are displayed) Labs Reviewed - No data to display  EKG  EKG Interpretation None       Radiology Dg Hand Complete Right  Result Date: 08/15/2016 CLINICAL DATA:  Right hand injury after punching a door. Laceration to lateral surface of fifth finger. EXAM: RIGHT HAND - COMPLETE 3+ VIEW COMPARISON:  None. FINDINGS: There is no evidence of fracture or dislocation. There is no evidence of arthropathy or other focal bone abnormality. No radiopaque foreign body. IMPRESSION: No acute osseous injury of the right hand. No radiopaque foreign body. Electronically Signed    By: Deatra RobinsonKevin  Herman M.D.   On: 08/15/2016 20:59    Procedures .Marland Kitchen.Laceration Repair Date/Time: 08/16/2016 11:36 AM Performed by: Alvira MondaySCHLOSSMAN, Ellicia Alix Authorized by: Alvira MondaySCHLOSSMAN, Chasten Blaze   Consent:    Consent obtained:  Verbal   Consent given by:  Patient   Risks discussed:  Infection and pain Anesthesia (see MAR for exact dosages):    Anesthesia method:  Local infiltration   Local anesthetic:  Lidocaine 1% w/o epi Laceration details:    Location:  Finger   Finger location:  R small finger   Length (cm):  2 Repair type:    Repair type:  Simple Pre-procedure details:    Preparation:  Patient was prepped and draped in usual sterile fashion Exploration:    Hemostasis achieved with:  Tourniquet   Wound exploration: entire depth of wound probed and visualized     Contaminated: no   Treatment:    Area cleansed with:  Betadine   Amount of cleaning:  Standard   Irrigation solution:  Sterile saline   Irrigation volume:  300   Irrigation method:  Syringe Skin repair:    Repair method:  Sutures and Steri-Strips   Suture size:  4-0   Suture material:  Prolene   Suture technique:  Simple interrupted   Number of sutures:  4   Number of Steri-Strips:  3 Approximation:    Approximation:  Loose Post-procedure details:    Dressing:  Splint for protection   Patient tolerance of procedure:  Tolerated well, no immediate complications   (including critical care time)  Medications Ordered in ED Medications  lidocaine (PF) (XYLOCAINE) 1 % injection 10 mL (10 mLs Intradermal Given 08/15/16 2148)  Tdap (BOOSTRIX) injection 0.5 mL (0.5 mLs Intramuscular Given 08/15/16 2145)     Initial Impression / Assessment and Plan / ED Course  I have reviewed the triage vital signs and the nursing notes.  Pertinent labs & imaging results that were available during my care of the patient were reviewed by me and considered in my medical decision making (see chart for details).  Clinical Course    25yo female  presents with concern for finger laceration after punching a wall. XR without fx or foreign body. Tetanus updated.  Laceration repaired as above. Pt without signs of tendon or neurovascular injury. Bleeding controlled initially with pressure, then with tourniquet during repair. Patient discharged in stable condition with understanding of reasons to return.     Final Clinical Impressions(s) / ED Diagnoses   Final diagnoses:  Laceration of right little finger without foreign body without damage to nail, initial encounter  Contusion of right hand, initial encounter    New Prescriptions Discharge Medication List as of 08/15/2016  9:43 PM       Alvira MondayErin Lawrnce Reyez, MD 08/16/16 1143

## 2017-11-15 ENCOUNTER — Emergency Department (HOSPITAL_COMMUNITY)
Admission: EM | Admit: 2017-11-15 | Discharge: 2017-11-15 | Disposition: A | Discharge status: Home / Self Care | Payer: Self-pay | Attending: Emergency Medicine | Admitting: Emergency Medicine

## 2017-11-15 ENCOUNTER — Other Ambulatory Visit: Payer: Self-pay

## 2017-11-15 ENCOUNTER — Encounter (HOSPITAL_COMMUNITY): Payer: Self-pay | Admitting: *Deleted

## 2017-11-15 DIAGNOSIS — L509 Urticaria, unspecified: Secondary | ICD-10-CM

## 2017-11-15 DIAGNOSIS — J45909 Unspecified asthma, uncomplicated: Secondary | ICD-10-CM | POA: Insufficient documentation

## 2017-11-15 MED ORDER — LORATADINE 10 MG PO TABS: 10.0000 mg | ORAL_TABLET | Freq: Every day | ORAL | 0 refills | Status: AC

## 2017-11-15 MED ORDER — PREDNISONE 10 MG (21) PO TBPK: ORAL_TABLET | Freq: Every day | ORAL | 0 refills | Status: DC

## 2017-11-15 MED ORDER — RANITIDINE HCL 150 MG PO CAPS: 150.0000 mg | ORAL_CAPSULE | Freq: Every day | ORAL | 0 refills | Status: AC

## 2017-11-15 MED ORDER — FAMOTIDINE 20 MG PO TABS
20.0000 mg | ORAL_TABLET | Freq: Once | ORAL | Status: AC
Start: 2017-11-15 — End: 2017-11-15
  Administered 2017-11-15: 20 mg via ORAL
  Filled 2017-11-15: qty 1

## 2017-11-15 MED ORDER — LORATADINE 10 MG PO TABS
10.0000 mg | ORAL_TABLET | Freq: Once | ORAL | Status: AC
  Administered 2017-11-15: 10 mg via ORAL
  Filled 2017-11-15: qty 1

## 2017-11-15 NOTE — ED Provider Notes (Signed)
[facility] EMERGENCY DEPARTMENT Provider Note   CSN: [id number] Arrival date & time: 11/15/17  1251     History   Chief Complaint Chief Complaint  Patient presents with  . Rash    HPI Erin Allison is a 27 y.o. female.  HPI   27 year old female presents today with complaints of hives.  Patient notes that she start develop hives to her scalp today.  She notes they are itchy and painful.  She denies any other involvement including oral.  Patient notes a significant past medical history of the same but is uncertain what is causing these allergic reactions.  She denies any new foods or drinks, increased stress, recent infectious illness, new medications, or any other body care products.  Patient notes in the past medications given have improved her symptoms.    Past Medical History:  Diagnosis Date  . Asthma     Patient Active Problem List   Diagnosis Date Noted  . Indication for care or intervention in labor or delivery 12/31/2014  . Active labor at term 12/31/2014  . SVD (spontaneous vaginal delivery) 12/31/2014  . Evaluate anatomy not seen on prior sonogram   . [redacted] weeks gestation of pregnancy   . Screening, antenatal, for fetal anatomic survey     Past Surgical History:  Procedure Laterality Date  . NO PAST SURGERIES      OB History    Gravida Para Term Preterm AB Living   1 1 1  0 0 1   SAB TAB Ectopic Multiple Live Births   0 0 0 0 1       Home Medications    Prior to Admission medications   Medication Sig Start Date End Date Taking? Authorizing Provider  albuterol (PROVENTIL HFA;VENTOLIN HFA) 108 (90 BASE) MCG/ACT inhaler Inhale 2 puffs into the lungs every 6 (six) hours as needed for wheezing (wheezing). wheezing    [provider]  diphenhydrAMINE (BENADRYL) 25 MG tablet Take 1 tablet (25 mg total) by mouth every 6 (six) hours as needed. 06/16/16   Janne Napoleon, NP  famotidine (PEPCID) 20 MG tablet Take 1 tablet (20 mg  total) by mouth 2 (two) times daily. 06/16/16   Janne Napoleon, NP  loratadine (CLARITIN) 10 MG tablet Take 1 tablet (10 mg total) by mouth daily. 11/15/17   Tavio Biegel, Tinnie Gens, PA-C  predniSONE (STERAPRED UNI-PAK 21 TAB) 10 MG (21) TBPK tablet Take by mouth daily. Take 5 tabs by mouth daily for 2 days, then 4 tabs for 2 days, then 3 tabs for 2 days, then 2 tabs for 2 days, 1 tabs for 2 days 11/15/17   Naksh Radi, Tinnie Gens, PA-C  ranitidine (ZANTAC) 150 MG capsule Take 1 capsule (150 mg total) by mouth daily. 11/15/17   Eyvonne Mechanic, PA-C    Family History History reviewed. No pertinent family history.  Social History Social History   Tobacco Use  . Smoking status: Never Smoker  . Smokeless tobacco: Never Used  Substance Use Topics  . Alcohol use: Yes    Comment: occasional  . Drug use: No     Allergies   Patient has no known allergies.   Review of Systems Review of Systems  All other systems reviewed and are negative.  Physical Exam Updated Vital Signs BP 133/87 (BP Location: Right Arm)   Pulse 97   Temp 98.2 F (36.8 C) (Oral)   Resp 18   SpO2 99%   Physical Exam  Constitutional: She is oriented  to person, place, and time. She appears well-developed and well-nourished.  HENT:  Head: Normocephalic and atraumatic.  No perioral or intraoral swelling or edema  Hives noted to scalp and forehead  Eyes: Conjunctivae are normal. Pupils are equal, round, and reactive to light. Right eye exhibits no discharge. Left eye exhibits no discharge. No scleral icterus.  Neck: Normal range of motion. No JVD present. No tracheal deviation present.  Pulmonary/Chest: Effort normal. No stridor. No respiratory distress. She has no wheezes. She has no rales. She exhibits no tenderness.  Neurological: She is alert and oriented to person, place, and time. Coordination normal.  Psychiatric: She has a normal mood and affect. Her behavior is normal. Judgment and thought content normal.  Nursing note  and vitals reviewed.    ED Treatments / Results  Labs (all labs ordered are listed, but only abnormal results are displayed) Labs Reviewed - No data to display  EKG  EKG Interpretation None       Radiology No results found.  Procedures Procedures (including critical care time)  Medications Ordered in ED Medications  loratadine (CLARITIN) tablet 10 mg (10 mg Oral Given 11/15/17 1622)  famotidine (PEPCID) tablet 20 mg (20 mg Oral Given 11/15/17 1622)     Initial Impression / Assessment and Plan / ED Course  I have reviewed the triage vital signs and the nursing notes.  Pertinent labs & imaging results that were available during my care of the patient were reviewed by me and considered in my medical decision making (see chart for details).      Final Clinical Impressions(s) / ED Diagnoses   Final diagnoses:  Hives    Labs:   Imaging:  Consults:  Therapeutics: Pepcid, Claritin  Discharge Meds: Zantac, Claritin, Prednisone  Assessment/Plan: 27 year old female presents today with hives.  Patient has significant past medical history the same.  She has only involvement of her scalp, no other involvement.  Patient given medications here.  I encouraged her to continue using antihistamines at home, if symptoms worsen or do not improve initiate steroid therapy.  Patient is given strict return precautions, she verbalized understanding and agreement to today's plan had no further questions or concerns at the time of discharge.     ED Discharge Orders        Ordered    ranitidine (ZANTAC) 150 MG capsule  Daily     11/15/17 1640    loratadine (CLARITIN) 10 MG tablet  Daily     11/15/17 1640    predniSONE (STERAPRED UNI-PAK 21 TAB) 10 MG (21) TBPK tablet  Daily     11/15/17 1649       Eyvonne MechanicHedges, Kippy Gohman, PA-C 11/15/17 1651    Gerhard MunchLockwood, Robert, MD 11/15/17 1902

## 2017-11-15 NOTE — ED Triage Notes (Signed)
Pt reports having hives on her scalp. Denies new hair products or dying her hair. Hx of similar episode and was given pills and dc home.

## 2017-11-15 NOTE — Discharge Instructions (Signed)
Please read attached information. If you experience any new or worsening signs or symptoms please return to the emergency room for evaluation. Please follow-up with your primary care provider or specialist as discussed. Please use medication prescribed only as directed and discontinue taking if you have any concerning signs or symptoms.   °

## 2017-11-16 ENCOUNTER — Other Ambulatory Visit: Payer: Self-pay

## 2017-11-16 ENCOUNTER — Emergency Department (HOSPITAL_COMMUNITY)
Admission: EM | Admit: 2017-11-16 | Discharge: 2017-11-16 | Disposition: A | Discharge status: Home / Self Care | Payer: Self-pay | Attending: Emergency Medicine | Admitting: Emergency Medicine

## 2017-11-16 ENCOUNTER — Encounter (HOSPITAL_COMMUNITY): Payer: Self-pay

## 2017-11-16 DIAGNOSIS — J45909 Unspecified asthma, uncomplicated: Secondary | ICD-10-CM | POA: Insufficient documentation

## 2017-11-16 DIAGNOSIS — Z79899 Other long term (current) drug therapy: Secondary | ICD-10-CM | POA: Insufficient documentation

## 2017-11-16 DIAGNOSIS — R220 Localized swelling, mass and lump, head: Secondary | ICD-10-CM | POA: Insufficient documentation

## 2017-11-16 NOTE — ED Provider Notes (Signed)
[facility] EMERGENCY DEPARTMENT Provider Note   CSN: [id number] Arrival date & time: 11/16/17  1729     History   Chief Complaint Chief Complaint  Patient presents with  . Facial Swelling    HPI Erin Allison is a 27 y.o. female.  HPI   27 year old female presents today with allergic reaction.  Patient was seen yesterday here in the emergency room after allergic reaction.  Patient had hives to her scalp, she was treated with antihistamines and steroids.  She notes she went home and took 1 dose of 50 mg prednisone.  She notes she woke up again and took another 50 mg, followed by a another 50 mg this evening.  Patient notes that she woke up this morning and had some minor swelling to her forehead and eyebrows.  Patient notes that the hives completely resolved, she denies any swelling of the lips mouth remainder of the face or upper extremities.  She denies any shortness of breath.   Past Medical History:  Diagnosis Date  . Asthma     Patient Active Problem List   Diagnosis Date Noted  . Indication for care or intervention in labor or delivery 12/31/2014  . Active labor at term 12/31/2014  . SVD (spontaneous vaginal delivery) 12/31/2014  . Evaluate anatomy not seen on prior sonogram   . [redacted] weeks gestation of pregnancy   . Screening, antenatal, for fetal anatomic survey     Past Surgical History:  Procedure Laterality Date  . NO PAST SURGERIES      OB History    Gravida Para Term Preterm AB Living   1 1 1  0 0 1   SAB TAB Ectopic Multiple Live Births   0 0 0 0 1       Home Medications    Prior to Admission medications   Medication Sig Start Date End Date Taking? Authorizing Provider  albuterol (PROVENTIL HFA;VENTOLIN HFA) 108 (90 BASE) MCG/ACT inhaler Inhale 2 puffs into the lungs every 6 (six) hours as needed for wheezing (wheezing). wheezing    [provider]  diphenhydrAMINE (BENADRYL) 25 MG tablet Take 1 tablet (25 mg total)  by mouth every 6 (six) hours as needed. 06/16/16   Janne Napoleon, NP  famotidine (PEPCID) 20 MG tablet Take 1 tablet (20 mg total) by mouth 2 (two) times daily. 06/16/16   Janne Napoleon, NP  loratadine (CLARITIN) 10 MG tablet Take 1 tablet (10 mg total) by mouth daily. 11/15/17   Doral Digangi, Tinnie Gens, PA-C  predniSONE (STERAPRED UNI-PAK 21 TAB) 10 MG (21) TBPK tablet Take by mouth daily. Take 5 tabs by mouth daily for 2 days, then 4 tabs for 2 days, then 3 tabs for 2 days, then 2 tabs for 2 days, 1 tabs for 2 days 11/15/17   Seab Axel, Tinnie Gens, PA-C  ranitidine (ZANTAC) 150 MG capsule Take 1 capsule (150 mg total) by mouth daily. 11/15/17   Eyvonne Mechanic, PA-C    Family History History reviewed. No pertinent family history.  Social History Social History   Tobacco Use  . Smoking status: Never Smoker  . Smokeless tobacco: Never Used  Substance Use Topics  . Alcohol use: Yes    Comment: occasional  . Drug use: No     Allergies   Patient has no known allergies.   Review of Systems Review of Systems  All other systems reviewed and are negative.   Physical Exam Updated Vital Signs BP 120/77 (BP Location: Right Arm)  Pulse 89   Temp 98.4 F (36.9 C) (Oral)   Resp 16   SpO2 100%   Physical Exam  Constitutional: She is oriented to person, place, and time. She appears well-developed and well-nourished.  HENT:  Head: Normocephalic and atraumatic.  Minor edema noted to forehead and eyebrows- no intraoral involvement, neck supple full active range of motion lung sounds clear  Eyes: Conjunctivae are normal. Pupils are equal, round, and reactive to light. Right eye exhibits no discharge. Left eye exhibits no discharge. No scleral icterus.  Neck: Normal range of motion. No JVD present. No tracheal deviation present.  Pulmonary/Chest: Effort normal. No stridor.  Neurological: She is alert and oriented to person, place, and time. Coordination normal.  Psychiatric: She has a normal mood and  affect. Her behavior is normal. Judgment and thought content normal.  Nursing note and vitals reviewed.  ED Treatments / Results  Labs (all labs ordered are listed, but only abnormal results are displayed) Labs Reviewed - No data to display  EKG  EKG Interpretation None      Radiology No results found.  Procedures Procedures (including critical care time)  Medications Ordered in ED Medications - No data to display   Initial Impression / Assessment and Plan / ED Course  I have reviewed the triage vital signs and the nursing notes.  Pertinent labs & imaging results that were available during my care of the patient were reviewed by me and considered in my medical decision making (see chart for details).     Final Clinical Impressions(s) / ED Diagnoses   Final diagnoses:  Facial swelling    Labs:   Imaging:  Consults:  Therapeutics:  Discharge Meds:   Assessment/Plan: 27 year old female presents today with edema to her forehead.  Hives have completely resolved, no intraoral or signs of angioedema.  Question fluid retention.  Patient encouraged to discontinue prednisone, use Zantac as needed, follow-up here in the emergency room if symptoms worsen or do not improve.  Patient verbalized understanding and agreement to today's plan had no further questions or concerns   ED Discharge Orders    None       Rosalio LoudHedges, Jetaime Pinnix, PA-C 11/16/17 2136    Mancel BaleWentz, Elliott, MD 11/17/17 [phone]

## 2017-11-16 NOTE — Discharge Instructions (Signed)
Please read attached information. If you experience any new or worsening signs or symptoms please return to the emergency room for evaluation. Please follow-up with your primary care provider or specialist as discussed.  °

## 2017-11-16 NOTE — ED Triage Notes (Signed)
Pt was seen here yesterday for possible allergic rxn, she states the hives have resolved but she now has some swelling to the temple area and at her eyes. Pt denies any shortness of breath. Pt reports taking prednisone as prescribed.

## 2017-11-16 NOTE — ED Notes (Signed)
Patient to ED c/o head swelling - was here yesterday and treated for allergic reaction to "edge control" product at the hair salon. Given medications for the bumps and itching, which have since resolved. Pt states new swelling to head, bilateral temples today, and reports it has gotten worse since she has been here. No vision changes, no dizziness. EDP at bedside.

## 2017-11-17 ENCOUNTER — Emergency Department (HOSPITAL_COMMUNITY)
Admission: EM | Admit: 2017-11-17 | Discharge: 2017-11-17 | Disposition: A | Discharge status: Home / Self Care | Payer: Self-pay | Attending: Emergency Medicine | Admitting: Emergency Medicine

## 2017-11-17 ENCOUNTER — Encounter (HOSPITAL_COMMUNITY): Payer: Self-pay | Admitting: Emergency Medicine

## 2017-11-17 DIAGNOSIS — R220 Localized swelling, mass and lump, head: Secondary | ICD-10-CM | POA: Insufficient documentation

## 2017-11-17 NOTE — ED Notes (Signed)
Pt given ice pack

## 2017-11-17 NOTE — ED Notes (Signed)
Pt refuses discharge vital signs. 

## 2017-11-17 NOTE — ED Triage Notes (Signed)
Pt reports had allergic reaction 2 days ago and took medications as prescribed. Yesterday she had swelling in her face and was [facility] and told to stop taking prednisone but continue taking Benadryl. This morning she woke up with bilat eye swelling. Pt reports that she has blurred vision and "seeing dots".

## 2017-11-17 NOTE — ED Provider Notes (Signed)
[facility]-EMERGENCY DEPT Provider Note   CSN: [id number] Arrival date & time: 11/17/17  1242     History   Chief Complaint Chief Complaint  Patient presents with  . Facial Swelling    HPI Erin Allison is a 27 y.o. female with a history of asthma who presents to the emergency department today for facial swelling.  Patient was seen here on 3/19 for an allergic reaction to her scalp after new hair product at a hair salon.  She was treated with antihistamines and steroids.  She took 1 dose of 50 mg prednisone while at home that night as well as the next morning and noted that she had minor swelling to her forehead and eyebrows.  The hives have completely resolved.  There was no involvement of the lips or the remainder of the face.  The patient was advised to stop taking prednisone and use Zantac as needed.  She is to take a daily Claritin and Benadryl as night for her symptoms.  She states she followed these protocols and awoke this morning with severe swelling around her eyes.  She notes she now originally saw white spots whenever she looks but this has resolved.  No restricted range of motion or pain full range of motion.  Wears glasses at baseline and notes no visual blurring or loss of vision.  She denies any lip swelling, difficulty swallowing, shortness of breath, wheezing, cough, abdominal pain, nausea/vomiting/diarrhea. Patient is not on any ACE-I or daily medications other then what is listed above.   HPI  Past Medical History:  Diagnosis Date  . Asthma     Patient Active Problem List   Diagnosis Date Noted  . Indication for care or intervention in labor or delivery 12/31/2014  . Active labor at term 12/31/2014  . SVD (spontaneous vaginal delivery) 12/31/2014  . Evaluate anatomy not seen on prior sonogram   . [redacted] weeks gestation of pregnancy   . Screening, antenatal, for fetal anatomic survey     Past Surgical History:  Procedure Laterality Date    . NO PAST SURGERIES      OB History    Gravida  1   Para  1   Term  1   Preterm  0   AB  0   Living  1     SAB  0   TAB  0   Ectopic  0   Multiple  0   Live Births  1            Home Medications    Prior to Admission medications   Medication Sig Start Date End Date Taking? Authorizing Provider  albuterol (PROVENTIL HFA;VENTOLIN HFA) 108 (90 BASE) MCG/ACT inhaler Inhale 2 puffs into the lungs every 6 (six) hours as needed for wheezing (wheezing). wheezing    [provider]  diphenhydrAMINE (BENADRYL) 25 MG tablet Take 1 tablet (25 mg total) by mouth every 6 (six) hours as needed. 06/16/16   Janne Napoleon, NP  famotidine (PEPCID) 20 MG tablet Take 1 tablet (20 mg total) by mouth 2 (two) times daily. 06/16/16   Janne Napoleon, NP  loratadine (CLARITIN) 10 MG tablet Take 1 tablet (10 mg total) by mouth daily. 11/15/17   Hedges, Tinnie Gens, PA-C  predniSONE (STERAPRED UNI-PAK 21 TAB) 10 MG (21) TBPK tablet Take by mouth daily. Take 5 tabs by mouth daily for 2 days, then 4 tabs for 2 days, then 3 tabs for 2 days, then  2 tabs for 2 days, 1 tabs for 2 days 11/15/17   Hedges, Tinnie Gens, PA-C  ranitidine (ZANTAC) 150 MG capsule Take 1 capsule (150 mg total) by mouth daily. 11/15/17   Eyvonne Mechanic, PA-C    Family History No family history on file.  Social History Social History   Tobacco Use  . Smoking status: Never Smoker  . Smokeless tobacco: Never Used  Substance Use Topics  . Alcohol use: Yes    Comment: occasional  . Drug use: No     Allergies   Patient has no known allergies.   Review of Systems Review of Systems  All other systems reviewed and are negative.    Physical Exam Updated Vital Signs BP 134/69 (BP Location: Right Arm)   Pulse 77   Temp 98 F (36.7 C) (Oral)   Resp 16   SpO2 100%   Physical Exam  Constitutional: She appears well-developed and well-nourished.  HENT:  Head: Normocephalic and atraumatic.  Right Ear: External  ear normal.  Left Ear: External ear normal.  Nose: Nose normal.  Mouth/Throat: Uvula is midline, oropharynx is clear and moist and mucous membranes are normal. No tonsillar exudate.  No lip, tongue or uvular swelling.  No angioedema.  Patient is tolerating his secretions.  Normal phonation.  No dysphasia.  Eyes: Pupils are equal, round, and reactive to light. Right eye exhibits no discharge. Left eye exhibits no discharge. No scleral icterus.  There is fluid-filled periorbital edema without overlying erythema or heat as noted in the picture.  Normal extra ocular motions without pain or difficulty.  Sclera and conjunctive are clear.  PERRLA intact.  Neck: Trachea normal and normal range of motion. Neck supple. No spinous process tenderness present. No neck rigidity. Normal range of motion present.  Cardiovascular: Normal rate, regular rhythm and intact distal pulses.  No murmur heard. Pulses:      Radial pulses are 2+ on the right side, and 2+ on the left side.       Dorsalis pedis pulses are 2+ on the right side, and 2+ on the left side.       Posterior tibial pulses are 2+ on the right side, and 2+ on the left side.  No lower extremity swelling or edema. Calves symmetric in size bilaterally.  Pulmonary/Chest: Effort normal and breath sounds normal. She exhibits no tenderness.  No increased work of breathing. No accessory muscle use. Patient is sitting upright, speaking in full sentences without difficulty   Abdominal: Soft. Bowel sounds are normal. There is no tenderness. There is no rebound and no guarding.  Musculoskeletal: She exhibits no edema.  Lymphadenopathy:    She has no cervical adenopathy.  Neurological: She is alert.  Skin: Skin is warm and dry. Capillary refill takes less than 2 seconds. No rash noted. Rash is not urticarial. She is not diaphoretic.  No rash  Psychiatric: She has a normal mood and affect.  Nursing note and vitals reviewed.    ED Treatments / Results   Labs (all labs ordered are listed, but only abnormal results are displayed) Labs Reviewed - No data to display  EKG  EKG Interpretation None       Radiology No results found.  Procedures Procedures (including critical care time)  Medications Ordered in ED Medications - No data to display   Initial Impression / Assessment and Plan / ED Course  I have reviewed the triage vital signs and the nursing notes.  Pertinent labs & imaging results that  were available during my care of the patient were reviewed by me and considered in my medical decision making (see chart for details).     27 y.o. female with a history of asthma who presents to the emergency department today for facial swelling.  Patient was seen here on 3/19 for an allergic reaction to her scalp after new hair product at a hair salon.  She was treated with antihistamines and steroids.  She took 1 dose of 50 mg prednisone while at home that night as well as the next morning and noted that she had minor swelling to her forehead and eyebrows.  The hives have completely resolved.  There was no involvement of the lips or the remainder of the face.  The patient was advised to stop taking prednisone and use Zantac as needed.  She is to take a daily Claritin and Benadryl as night for her symptoms.  She states she followed these protocols and awoke this morning with severe swelling around her eyes.  She notes she now originally saw white spots whenever she looks but this has resolved.  No restricted range of motion or pain full range of motion.  Wears glasses at baseline and notes no visual blurring or loss of vision.  She denies any lip swelling, difficulty swallowing, shortness of breath, wheezing, cough, abdominal pain, nausea/vomiting/diarrhea. Patient is not on any ACE-I or daily medications other then what is listed above.   On exam the patient is noted to have periorbital edema that is fluid-filled without overlying erythema or  heat.  No painful extraocular motions.  Do not suspect orbital cellulitis.  Normal sclera and conjunctiva.  After discussion with Dr. Erma HeritageIsaacs feel this is likely related to dependent edema.  The patient is without angioedema and lungs are clear to auscultation bilaterally.  There are no hives present and patient is without vomiting or diarrhea.  No signs of acute allergic reaction at this time.  Advised the patient to continue at home therapeutics that were previously prescribed.  Advise ice to the area.  Patient is recommended to sleep on her back and not on one side to avoid edema shifting to one side. I advised specific return precautions discussed. She is to return for worsening symptoms or new concerning symptoms. Time was given for all questions to be answered. The patient verbalized understanding and agreement with plan. The patient appears safe for discharge home.  To note the patient was very unhappy with this answer. She asked if there was anything to allow immediate reduction in swelling.  I counseled the patient on her diagnosis and took time to answer all questions.   Patient case discussed with Dr. Erma HeritageIsaacs who is in agreement with plan.  Final Clinical Impressions(s) / ED Diagnoses   Final diagnoses:  Facial swelling    ED Discharge Orders    None       Princella PellegriniMaczis, Saturnino Liew M, PA-C 11/17/17 1506    Shaune PollackIsaacs, Cameron, MD 11/17/17 2049

## 2017-11-17 NOTE — Discharge Instructions (Addendum)
Your symptoms are related to shifting of the edema. Please make sure to sleep on your back and not on one side. Apply ice to the affected area three times per day. Please continue at home medications as previously recommended. If you develop worsening or new concerning symptoms you can return to the emergency department for re-evaluation.

## 2017-12-08 ENCOUNTER — Ambulatory Visit: Payer: Self-pay | Admitting: Allergy

## 2018-01-04 ENCOUNTER — Ambulatory Visit: Payer: Self-pay | Admitting: Allergy

## 2018-08-26 ENCOUNTER — Other Ambulatory Visit: Payer: Self-pay

## 2018-08-26 ENCOUNTER — Emergency Department (HOSPITAL_COMMUNITY)
Admission: EM | Admit: 2018-08-26 | Discharge: 2018-08-26 | Disposition: A | Discharge status: Home / Self Care | Payer: Self-pay | Attending: Emergency Medicine | Admitting: Emergency Medicine

## 2018-08-26 DIAGNOSIS — L500 Allergic urticaria: Secondary | ICD-10-CM | POA: Insufficient documentation

## 2018-08-26 DIAGNOSIS — R05 Cough: Secondary | ICD-10-CM | POA: Insufficient documentation

## 2018-08-26 DIAGNOSIS — T7840XA Allergy, unspecified, initial encounter: Secondary | ICD-10-CM | POA: Insufficient documentation

## 2018-08-26 MED ORDER — DIPHENHYDRAMINE HCL 50 MG/ML IJ SOLN
50.0000 mg | Freq: Once | INTRAMUSCULAR | Status: AC
  Administered 2018-08-26: 50 mg via INTRAMUSCULAR
  Filled 2018-08-26: qty 1

## 2018-08-26 MED ORDER — CETIRIZINE HCL 10 MG PO TABS: 10.0000 mg | ORAL_TABLET | Freq: Every day | ORAL | 0 refills | Status: AC

## 2018-08-26 MED ORDER — PREDNISONE 20 MG PO TABS: 40.0000 mg | ORAL_TABLET | Freq: Every day | ORAL | 0 refills | Status: AC

## 2018-08-26 MED ORDER — EPINEPHRINE 0.3 MG/0.3ML IJ SOAJ: 0.3000 mg | Freq: Once | INTRAMUSCULAR | 1 refills | Status: AC

## 2018-08-26 MED ORDER — LORATADINE 5 MG/5ML PO SYRP
10.0000 mg | ORAL_SOLUTION | Freq: Once | ORAL | Status: AC
  Administered 2018-08-26: 10 mg via ORAL
  Filled 2018-08-26: qty 10

## 2018-08-26 NOTE — Discharge Instructions (Signed)
10 you taking 50 mg of Benadryl every 6 hours, 10 mg of Zyrtec daily, prednisone daily until the rash goes away.  You were given a prescription for an EpiPen.  You should only use this if you start having any breathing difficulty.  It will also bili important for you to follow-up with allergy so you can find out what is causing these reactions.

## 2018-08-26 NOTE — ED Provider Notes (Signed)
[facility]   CSN: [id number] Arrival date & time: 08/26/18  1314     History   Chief Complaint Chief Complaint  Patient presents with  . Pruritis    HPI Erin BignessMekia L Leatha Allison is a 27 y.o. female.  Patient is a 27 year old female with a history of asthma and prior allergic reactions who is presenting today with a new allergic reaction.  She states last night she started having itching and swelling of her scalp and hives that came up on her face.  This morning they were still present and she took 50 mg of Benadryl, 60 mg of prednisone and Zantac.  She states however symptoms are not improving and she is now having itching in her hands and feet as well.  Last Benadryl was at 8 AM.  She has no oral or airway involvement at this time.  She states this has happened in the past but she does not know what is causing the reaction.  She was going to see an allergist but they kept changing her appointment and she was never seen.  She is otherwise well-appearing and in no distress.  She did take TheraFlu last night because she has had nasal congestion and URI symptoms because her son has been sick.  She states that the only new thing she has taken.  She has not noticed rash on any other areas of her body except her scalp and face.  No fever but has had cough.  The history is provided by the patient.    Past Medical History:  Diagnosis Date  . Asthma     Patient Active Problem List   Diagnosis Date Noted  . Indication for care or intervention in labor or delivery 12/31/2014  . Active labor at term 12/31/2014  . SVD (spontaneous vaginal delivery) 12/31/2014  . Evaluate anatomy not seen on prior sonogram   . [redacted] weeks gestation of pregnancy   . Screening, antenatal, for fetal anatomic survey     Past Surgical History:  Procedure Laterality Date  . NO PAST SURGERIES       OB History    Gravida  1   Para  1   Term  1   Preterm  0     AB  0   Living  1     SAB  0   TAB  0   Ectopic  0   Multiple  0   Live Births  1            Home Medications    Prior to Admission medications   Medication Sig Start Date End Date Taking? Authorizing Provider  albuterol (PROVENTIL HFA;VENTOLIN HFA) 108 (90 BASE) MCG/ACT inhaler Inhale 2 puffs into the lungs every 6 (six) hours as needed for wheezing (wheezing). wheezing    [provider]  diphenhydrAMINE (BENADRYL) 25 MG tablet Take 1 tablet (25 mg total) by mouth every 6 (six) hours as needed. Patient taking differently: Take 25 mg by mouth every 6 (six) hours as needed for allergies.  06/16/16   Janne NapoleonNeese, Hope M, NP  famotidine (PEPCID) 20 MG tablet Take 1 tablet (20 mg total) by mouth 2 (two) times daily. Patient not taking: Reported on 11/17/2017 06/16/16   Janne NapoleonNeese, Hope M, NP  loratadine (CLARITIN) 10 MG tablet Take 1 tablet (10 mg total) by mouth daily. 11/15/17   Hedges, Tinnie GensJeffrey, PA-C  predniSONE (STERAPRED UNI-PAK 21 TAB) 10 MG (21) TBPK tablet Take  by mouth daily. Take 5 tabs by mouth daily for 2 days, then 4 tabs for 2 days, then 3 tabs for 2 days, then 2 tabs for 2 days, 1 tabs for 2 days Patient not taking: Reported on 11/17/2017 11/15/17   Hedges, Tinnie Gens, PA-C  ranitidine (ZANTAC) 150 MG capsule Take 1 capsule (150 mg total) by mouth daily. 11/15/17   Eyvonne Mechanic, PA-C    Family History No family history on file.  Social History Social History   Tobacco Use  . Smoking status: Never Smoker  . Smokeless tobacco: Never Used  Substance Use Topics  . Alcohol use: Yes    Comment: occasional  . Drug use: No     Allergies   Patient has no known allergies.   Review of Systems Review of Systems  All other systems reviewed and are negative.    Physical Exam Updated Vital Signs BP 94/60 (BP Location: Right Arm)   Pulse 90   Temp 98 F (36.7 C) (Oral)   Resp 16   SpO2 95%   Physical Exam Vitals signs and nursing note reviewed.   Constitutional:      General: She is not in acute distress.    Appearance: She is well-developed.  HENT:     Head: Normocephalic and atraumatic.     Comments: No lip swelling, tongue swelling, uvular swelling or oral involvement Eyes:     Conjunctiva/sclera: Conjunctivae normal.     Pupils: Pupils are equal, round, and reactive to light.  Neck:     Musculoskeletal: Normal range of motion and neck supple.     Comments: No stridor Cardiovascular:     Rate and Rhythm: Normal rate and regular rhythm.     Heart sounds: No murmur.  Pulmonary:     Effort: Pulmonary effort is normal. No respiratory distress.     Breath sounds: Normal breath sounds. No wheezing or rales.  Abdominal:     General: There is no distension.     Palpations: Abdomen is soft.     Tenderness: There is no abdominal tenderness. There is no guarding or rebound.  Musculoskeletal: Normal range of motion.        General: No tenderness.  Skin:    General: Skin is warm and dry.     Findings: Rash present. No erythema. Rash is urticarial.     Comments: Urticarial rash present throughout her scalp, forehead, ears and trailing onto her face.  No other rash noted in any other areas of her body.  Neurological:     Mental Status: She is alert and oriented to person, place, and time.  Psychiatric:        Mood and Affect: Mood normal.        Behavior: Behavior normal.      ED Treatments / Results  Labs (all labs ordered are listed, but only abnormal results are displayed) Labs Reviewed - No data to display  EKG None  Radiology No results found.  Procedures Procedures (including critical care time)  Medications Ordered in ED Medications  diphenhydrAMINE (BENADRYL) injection 50 mg (has no administration in time range)  loratadine (CLARITIN) 5 MG/5ML syrup 10 mg (has no administration in time range)     Initial Impression / Assessment and Plan / ED Course  I have reviewed the triage vital signs and the  nursing notes.  Pertinent labs & imaging results that were available during my care of the patient were reviewed by me and considered in my medical decision  making (see chart for details).     Presenting today with symptoms most consistent with an allergic reaction.  She has an urticarial rash over her scalp and face.  She has no airway involvement at this time.  Unclear the cause of her symptoms but started last night.  She states the only thing different yesterday was taking TheraFlu.  She has taken 60 mg of prednisone already today that she had leftover from her last reaction, Zantac and Benadryl.  Her last dose of Benadryl was 7 hours ago.  Will give IM Benadryl and 10 mg of Zyrtec.  She is not in anaphylaxis at this time that would require epi.  We will continue to monitor to ensure her symptoms improve.  4:34 PM Some improvement in symptoms after above therapy.  Patient will be given a new prescription for prednisone will continue Benadryl and will do Zyrtec.  Also given an EpiPen and encouraged allergy follow-up.  Final Clinical Impressions(s) / ED Diagnoses   Final diagnoses:  Allergic reaction, initial encounter    ED Discharge Orders         Ordered    predniSONE (DELTASONE) 20 MG tablet  Daily     08/26/18 1638    cetirizine (ZYRTEC ALLERGY) 10 MG tablet  Daily     08/26/18 1638    EPINEPHrine (EPIPEN 2-PAK) 0.3 mg/0.3 mL IJ SOAJ injection   Once     08/26/18 1638           Gwyneth SproutPlunkett, Chayden Garrelts, MD 08/26/18 1638

## 2018-08-26 NOTE — ED Notes (Signed)
Patient verbalizes understanding of discharge instructions. Opportunity for questioning and answers were provided. Armband removed by staff, pt discharged from ED.  

## 2018-08-26 NOTE — ED Triage Notes (Signed)
Pt endorses generalized itching after taking theraflu last night. States she has rash on her head. Airway intact, breath sounds clear. VSS

## 2019-09-28 ENCOUNTER — Ambulatory Visit: Payer: Self-pay | Admitting: *Deleted

## 2019-09-28 NOTE — Telephone Encounter (Signed)
Pt has 3 people in the home that are covid positive, one being a 28 yr old. Would like to know how they are to get around in the house with all positive people?   Patient states her whole family is + COVID- do they have to wear masks in the house if everyone is + COVID. Advised no mask needed- but continue to clean surfaces and isolate together. May check on mother who is also + COVID.  Reason for Disposition . [1] COVID-19 diagnosed by positive lab test AND [2] mild symptoms (e.g., cough, fever, others) AND [3] no complications or SOB  Protocols used: CORONAVIRUS (COVID-19) DIAGNOSED OR SUSPECTED-A-AH
